# Patient Record
Sex: Female | Born: 1965 | Race: White | Hispanic: No | Smoking: Never smoker
Health system: Southern US, Community
[De-identification: ages and names within clinical notes are randomized; demographics above are authoritative.]

## PROBLEM LIST (undated history)

## (undated) DIAGNOSIS — B009 Herpesviral infection, unspecified: Secondary | ICD-10-CM

## (undated) DIAGNOSIS — K589 Irritable bowel syndrome without diarrhea: Secondary | ICD-10-CM

## (undated) DIAGNOSIS — E785 Hyperlipidemia, unspecified: Secondary | ICD-10-CM

## (undated) DIAGNOSIS — H9400 Acoustic neuritis in infectious and parasitic diseases classified elsewhere, unspecified ear: Secondary | ICD-10-CM

## (undated) DIAGNOSIS — R7301 Impaired fasting glucose: Secondary | ICD-10-CM

## (undated) DIAGNOSIS — L719 Rosacea, unspecified: Secondary | ICD-10-CM

## (undated) DIAGNOSIS — B977 Papillomavirus as the cause of diseases classified elsewhere: Secondary | ICD-10-CM

## (undated) DIAGNOSIS — K4090 Unilateral inguinal hernia, without obstruction or gangrene, not specified as recurrent: Principal | ICD-10-CM

## (undated) DIAGNOSIS — I8390 Asymptomatic varicose veins of unspecified lower extremity: Secondary | ICD-10-CM

## (undated) DIAGNOSIS — K219 Gastro-esophageal reflux disease without esophagitis: Secondary | ICD-10-CM

## (undated) DIAGNOSIS — R12 Heartburn: Secondary | ICD-10-CM

## (undated) HISTORY — PX: COLONSCOPY BEDSIDE: WVUENDOPRO140

## (undated) HISTORY — PX: HX HIP REPLACEMENT: SHX124

## (undated) HISTORY — PX: HX UPPER ENDOSCOPY: 2100001144

## (undated) HISTORY — DX: Herpesviral infection, unspecified: B00.9

## (undated) HISTORY — PX: LAPAROSCOPIC UNILATERAL SALPINGO OOPHERECTOMY: SHX5935

## (undated) HISTORY — DX: Papillomavirus as the cause of diseases classified elsewhere: B97.7

## (undated) HISTORY — DX: Irritable bowel syndrome, unspecified: K58.9

## (undated) HISTORY — DX: Asymptomatic varicose veins of unspecified lower extremity: I83.90

## (undated) HISTORY — DX: Hyperlipidemia, unspecified: E78.5

## (undated) HISTORY — DX: Impaired fasting glucose: R73.01

## (undated) HISTORY — DX: Unilateral inguinal hernia, without obstruction or gangrene, not specified as recurrent: K40.90

## (undated) HISTORY — PX: KNEE SURGERY: SHX244

## (undated) HISTORY — DX: Morbid (severe) obesity due to excess calories: E66.01

## (undated) HISTORY — PX: FOOT SURGERY: SHX648

## (undated) HISTORY — PX: NASAL SINUS SURGERY: SHX719

## (undated) HISTORY — DX: Acoustic neuritis in infectious and parasitic diseases classified elsewhere, unspecified ear: H94.00

---
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
## 1991-03-10 HISTORY — PX: ABDOMINAL HYSTERECTOMY: SHX81

## 1999-04-02 ENCOUNTER — Encounter: Admission: RE | Admit: 1999-04-02 | Discharge: 1999-04-02 | Payer: Self-pay | Admitting: Gastroenterology

## 1999-04-02 ENCOUNTER — Encounter: Payer: Self-pay | Admitting: Gastroenterology

## 1999-04-09 ENCOUNTER — Ambulatory Visit (HOSPITAL_COMMUNITY): Admission: RE | Admit: 1999-04-09 | Discharge: 1999-04-09 | Payer: Self-pay | Admitting: Gastroenterology

## 1999-04-24 ENCOUNTER — Ambulatory Visit (HOSPITAL_COMMUNITY): Admission: RE | Admit: 1999-04-24 | Discharge: 1999-04-24 | Payer: Self-pay | Admitting: Gastroenterology

## 1999-04-24 ENCOUNTER — Encounter: Payer: Self-pay | Admitting: Gastroenterology

## 1999-07-16 ENCOUNTER — Encounter: Admission: RE | Admit: 1999-07-16 | Discharge: 1999-07-16 | Payer: Self-pay | Admitting: Gastroenterology

## 1999-07-16 ENCOUNTER — Encounter: Payer: Self-pay | Admitting: Gastroenterology

## 2000-03-18 ENCOUNTER — Other Ambulatory Visit: Admission: RE | Admit: 2000-03-18 | Discharge: 2000-03-18 | Payer: Self-pay | Admitting: Obstetrics and Gynecology

## 2000-10-21 ENCOUNTER — Ambulatory Visit: Admission: RE | Admit: 2000-10-21 | Discharge: 2000-10-21 | Payer: Self-pay | Admitting: Obstetrics and Gynecology

## 2004-06-04 ENCOUNTER — Other Ambulatory Visit: Admission: RE | Admit: 2004-06-04 | Discharge: 2004-06-04 | Payer: Self-pay | Admitting: Obstetrics and Gynecology

## 2005-06-15 ENCOUNTER — Other Ambulatory Visit: Admission: RE | Admit: 2005-06-15 | Discharge: 2005-06-15 | Payer: Self-pay | Admitting: Obstetrics and Gynecology

## 2010-05-13 ENCOUNTER — Other Ambulatory Visit: Payer: Self-pay | Admitting: Obstetrics and Gynecology

## 2010-05-27 ENCOUNTER — Other Ambulatory Visit: Payer: Self-pay | Admitting: Obstetrics and Gynecology

## 2011-03-13 ENCOUNTER — Other Ambulatory Visit (HOSPITAL_BASED_OUTPATIENT_CLINIC_OR_DEPARTMENT_OTHER): Payer: Self-pay | Admitting: Family Medicine

## 2011-03-13 ENCOUNTER — Ambulatory Visit (HOSPITAL_BASED_OUTPATIENT_CLINIC_OR_DEPARTMENT_OTHER)
Admission: RE | Admit: 2011-03-13 | Discharge: 2011-03-13 | Disposition: A | Discharge status: Home / Self Care | Payer: 59 | Source: Ambulatory Visit | Attending: Family Medicine | Admitting: Family Medicine

## 2011-03-13 DIAGNOSIS — R05 Cough: Secondary | ICD-10-CM

## 2011-03-13 DIAGNOSIS — R059 Cough, unspecified: Secondary | ICD-10-CM | POA: Insufficient documentation

## 2011-04-10 LAB — HM PAP SMEAR

## 2011-04-15 ENCOUNTER — Other Ambulatory Visit (HOSPITAL_BASED_OUTPATIENT_CLINIC_OR_DEPARTMENT_OTHER): Payer: Self-pay | Admitting: Obstetrics and Gynecology

## 2011-04-15 DIAGNOSIS — Z1231 Encounter for screening mammogram for malignant neoplasm of breast: Secondary | ICD-10-CM

## 2011-04-30 ENCOUNTER — Other Ambulatory Visit: Payer: Self-pay | Admitting: Dermatology

## 2011-05-20 ENCOUNTER — Ambulatory Visit (HOSPITAL_BASED_OUTPATIENT_CLINIC_OR_DEPARTMENT_OTHER): Payer: 59

## 2011-05-27 ENCOUNTER — Ambulatory Visit (HOSPITAL_BASED_OUTPATIENT_CLINIC_OR_DEPARTMENT_OTHER)
Admission: RE | Admit: 2011-05-27 | Discharge: 2011-05-27 | Disposition: A | Discharge status: Home / Self Care | Payer: 59 | Source: Ambulatory Visit | Attending: Obstetrics and Gynecology | Admitting: Obstetrics and Gynecology

## 2011-05-27 DIAGNOSIS — Z1231 Encounter for screening mammogram for malignant neoplasm of breast: Secondary | ICD-10-CM | POA: Insufficient documentation

## 2011-05-28 ENCOUNTER — Other Ambulatory Visit (HOSPITAL_BASED_OUTPATIENT_CLINIC_OR_DEPARTMENT_OTHER): Payer: Self-pay | Admitting: Family Medicine

## 2011-05-28 ENCOUNTER — Ambulatory Visit (HOSPITAL_BASED_OUTPATIENT_CLINIC_OR_DEPARTMENT_OTHER)
Admission: RE | Admit: 2011-05-28 | Discharge: 2011-05-28 | Disposition: A | Discharge status: Home / Self Care | Payer: 59 | Source: Ambulatory Visit | Attending: Family Medicine | Admitting: Family Medicine

## 2011-05-28 DIAGNOSIS — J328 Other chronic sinusitis: Secondary | ICD-10-CM

## 2011-05-28 DIAGNOSIS — J329 Chronic sinusitis, unspecified: Secondary | ICD-10-CM

## 2011-05-28 DIAGNOSIS — R070 Pain in throat: Secondary | ICD-10-CM | POA: Insufficient documentation

## 2011-06-18 ENCOUNTER — Other Ambulatory Visit: Payer: Self-pay | Admitting: Obstetrics and Gynecology

## 2011-06-18 DIAGNOSIS — R1032 Left lower quadrant pain: Secondary | ICD-10-CM

## 2011-06-18 DIAGNOSIS — M549 Dorsalgia, unspecified: Secondary | ICD-10-CM

## 2011-06-19 ENCOUNTER — Ambulatory Visit
Admission: RE | Admit: 2011-06-19 | Discharge: 2011-06-19 | Disposition: A | Discharge status: Home / Self Care | Payer: 59 | Source: Ambulatory Visit | Attending: Obstetrics and Gynecology | Admitting: Obstetrics and Gynecology

## 2011-06-19 DIAGNOSIS — M549 Dorsalgia, unspecified: Secondary | ICD-10-CM

## 2011-06-19 DIAGNOSIS — R1032 Left lower quadrant pain: Secondary | ICD-10-CM

## 2011-06-19 MED ORDER — IOHEXOL 300 MG/ML  SOLN
125.0000 mL | Freq: Once | INTRAMUSCULAR | Status: AC | PRN
  Administered 2011-06-19: 125 mL via INTRAVENOUS

## 2011-06-25 ENCOUNTER — Ambulatory Visit (INDEPENDENT_AMBULATORY_CARE_PROVIDER_SITE_OTHER): Payer: 59 | Admitting: Surgery

## 2011-06-25 ENCOUNTER — Encounter (INDEPENDENT_AMBULATORY_CARE_PROVIDER_SITE_OTHER): Payer: Self-pay | Admitting: Surgery

## 2011-06-25 VITALS — BP 132/86 | HR 70 | Temp 97.3°F | Resp 16 | Ht 69.0 in | Wt 273.2 lb

## 2011-06-25 DIAGNOSIS — K4090 Unilateral inguinal hernia, without obstruction or gangrene, not specified as recurrent: Secondary | ICD-10-CM | POA: Insufficient documentation

## 2011-06-25 HISTORY — DX: Unilateral inguinal hernia, without obstruction or gangrene, not specified as recurrent: K40.90

## 2011-06-25 NOTE — Patient Instructions (Signed)
We will schedule your surgery today. 

## 2011-06-25 NOTE — Progress Notes (Signed)
Patient ID: Betty Cooper, female   DOB: 12-29-1965, 46 y.o.   MRN: [id number]  Chief Complaint  Patient presents with  . Inguinal Hernia      HPI Betty Cooper is a 46 y.o. female.  Referred by Dr. Cherly Hensen for evaluation of left inguinal hernia HPI 46 yo female presents with a 5 week history of constant left groin pain.  She denies any swelling in this area.  She does have a left ovary remaining, but no abnormalities were noted on pelvic exam and ultrasound.  A CT scan shows a left inguinal hernia containing only fat.  She denies any obstructive symptoms.   Past Medical History  Diagnosis Date  . Left inguinal hernia 06/25/2011  History of severe endometriosis  Past Surgical History  Procedure Date  . Foot surgery   . Abdominal hysterectomy 1993    Partial hysterectomy  . Tonsillectomy and adenoidectomy 1987    Family History  Problem Relation Age of Onset  . Cancer Paternal Grandfather     colon cancer    Social History History  Substance Use Topics  . Smoking status: Not on file  . Smokeless tobacco: Not on file  . Alcohol Use: 0.0 oz/week    1-2 drink(s) per week    No Known Allergies  Current Outpatient Prescriptions  Medication Sig Dispense Refill  . benzonatate (TESSALON) 200 MG capsule       . chlorpheniramine-HYDROcodone (TUSSIONEX) 10-8 MG/5ML LQCR       . HYDROcodone-acetaminophen (VICODIN) 5-500 MG per tablet       . HYDROcodone-homatropine (HYCODAN) 5-1.5 MG/5ML syrup       . ibuprofen (ADVIL,MOTRIN) 800 MG tablet         Review of Systems Review of Systems  Constitutional: Negative for fever, chills and unexpected weight change.  HENT: Negative for hearing loss, congestion, sore throat, trouble swallowing and voice change.   Eyes: Negative for visual disturbance.  Respiratory: Negative for cough and wheezing.   Cardiovascular: Negative for chest pain, palpitations and leg swelling.  Gastrointestinal: Positive for abdominal pain. Negative for  nausea, vomiting, diarrhea, constipation, blood in stool, abdominal distention and anal bleeding.  Genitourinary: Negative for hematuria, vaginal bleeding and difficulty urinating.  Musculoskeletal: Negative for arthralgias.  Skin: Negative for rash and wound.  Neurological: Negative for seizures, syncope and headaches.  Hematological: Negative for adenopathy. Does not bruise/bleed easily.  Psychiatric/Behavioral: Negative for confusion.    Blood pressure 132/86, pulse 70, temperature 97.3 F (36.3 C), temperature source Temporal, resp. rate 16, height 5\' 9"  (1.753 m), weight 273 lb 3.2 oz (123.923 kg).  Physical Exam Physical Exam WDWN in NAD HEENT:  EOMI, sclera anicteric Neck:  No masses, no thyromegaly Lungs:  CTA bilaterally; normal respiratory effort CV:  Regular rate and rhythm; no murmurs Abd:  +bowel sounds, soft, non-tender, no masses GU:  No visible bulges; palpable reducible left inguinal hernia Ext:  Well-perfused; no edema Skin:  Warm, dry; no sign of jaundice  Data Reviewed CT abdomen/pelvis  Assessment    Left inguinal hernia    Plan    Left inguinal hernia repair with mesh.  The surgical procedure has been discussed with the patient.  Potential risks, benefits, alternative treatments, and expected outcomes have been explained.  All of the patient's questions at this time have been answered.  The likelihood of reaching the patient's treatment goal is good.  The patient understand the proposed surgical procedure and wishes to proceed.  Danton Palmateer K. 06/25/2011, 11:58 AM

## 2011-07-02 ENCOUNTER — Telehealth (INDEPENDENT_AMBULATORY_CARE_PROVIDER_SITE_OTHER): Payer: Self-pay | Admitting: Surgery

## 2011-07-07 ENCOUNTER — Encounter (HOSPITAL_COMMUNITY): Payer: Self-pay | Admitting: Pharmacy Technician

## 2011-07-08 ENCOUNTER — Telehealth (INDEPENDENT_AMBULATORY_CARE_PROVIDER_SITE_OTHER): Payer: Self-pay | Admitting: Surgery

## 2011-07-09 ENCOUNTER — Encounter (HOSPITAL_COMMUNITY)
Admission: RE | Admit: 2011-07-09 | Discharge: 2011-07-09 | Disposition: A | Discharge status: Home / Self Care | Payer: 59 | Source: Ambulatory Visit | Attending: Surgery | Admitting: Surgery

## 2011-07-09 ENCOUNTER — Encounter (HOSPITAL_COMMUNITY): Payer: Self-pay

## 2011-07-09 HISTORY — DX: Rosacea, unspecified: L71.9

## 2011-07-09 LAB — SURGICAL PCR SCREEN: Staphylococcus aureus: NEGATIVE

## 2011-07-09 NOTE — Patient Instructions (Signed)
[address] [name]  07/09/2011   Your procedure is scheduled on: 07/10/11  Report to SHORT STAY DEPT  at 5:30 AM.  Call this number if you have problems the morning of surgery: [phone]   Remember:   Do not eat food AFTER MIDNIGHT  May have clear liquids UNTIL 6 HOURS BEFORE SURGERY  Clear liquids include soda, tea, black coffee, apple or grape juice, broth.  Take these medicines the morning of surgery with A SIP OF WATER:    Do not wear jewelry, make-up or nail polish.  Do not wear lotions, powders, or perfumes. You may wear deodorant.  Do not shave legs or underarms 48 hrs. before surgery (men may shave face)  Do not bring valuables to the hospital.  Contacts, dentures or bridgework may not be worn into surgery.  Leave suitcase in the car. After surgery it may be brought to your room.  For patients admitted to the hospital, checkout time is 11:00 AM the day of discharge.   Patients discharged the day of surgery will not be allowed to drive home.       Please read over the following fact sheets that you were given: MRSA Information

## 2011-07-10 ENCOUNTER — Encounter (HOSPITAL_COMMUNITY): Payer: Self-pay | Admitting: Anesthesiology

## 2011-07-10 ENCOUNTER — Ambulatory Visit (HOSPITAL_COMMUNITY): Payer: 59 | Admitting: Anesthesiology

## 2011-07-10 ENCOUNTER — Encounter (HOSPITAL_COMMUNITY): Payer: Self-pay | Admitting: *Deleted

## 2011-07-10 ENCOUNTER — Encounter (HOSPITAL_COMMUNITY)
Admission: RE | Disposition: A | Discharge status: Home / Self Care | Payer: Self-pay | Source: Ambulatory Visit | Attending: Surgery

## 2011-07-10 ENCOUNTER — Ambulatory Visit (HOSPITAL_COMMUNITY)
Admission: RE | Admit: 2011-07-10 | Discharge: 2011-07-10 | Disposition: A | Discharge status: Home / Self Care | Payer: 59 | Source: Ambulatory Visit | Attending: Surgery | Admitting: Surgery

## 2011-07-10 DIAGNOSIS — Z01812 Encounter for preprocedural laboratory examination: Secondary | ICD-10-CM | POA: Insufficient documentation

## 2011-07-10 DIAGNOSIS — Z90710 Acquired absence of both cervix and uterus: Secondary | ICD-10-CM | POA: Insufficient documentation

## 2011-07-10 DIAGNOSIS — K4090 Unilateral inguinal hernia, without obstruction or gangrene, not specified as recurrent: Secondary | ICD-10-CM

## 2011-07-10 DIAGNOSIS — Z79899 Other long term (current) drug therapy: Secondary | ICD-10-CM | POA: Insufficient documentation

## 2011-07-10 HISTORY — PX: INGUINAL HERNIA REPAIR: SHX194

## 2011-07-10 SURGERY — REPAIR, HERNIA, INGUINAL, ADULT
Anesthesia: General | Wound class: Clean

## 2011-07-10 MED ORDER — BUPIVACAINE-EPINEPHRINE 0.25% -1:200000 IJ SOLN
INTRAMUSCULAR | Status: AC
  Filled 2011-07-10: qty 1

## 2011-07-10 MED ORDER — LACTATED RINGERS IV SOLN
INTRAVENOUS | Status: DC | PRN
  Administered 2011-07-10: 07:00:00 via INTRAVENOUS

## 2011-07-10 MED ORDER — MORPHINE SULFATE 10 MG/ML IJ SOLN: 2.0000 mg | INTRAMUSCULAR | Status: DC | PRN

## 2011-07-10 MED ORDER — MIDAZOLAM HCL 5 MG/5ML IJ SOLN
INTRAMUSCULAR | Status: DC | PRN
  Administered 2011-07-10: 2 mg via INTRAVENOUS

## 2011-07-10 MED ORDER — ACETAMINOPHEN 10 MG/ML IV SOLN
INTRAVENOUS | Status: DC | PRN
  Administered 2011-07-10: 1000 mg via INTRAVENOUS

## 2011-07-10 MED ORDER — PROMETHAZINE HCL 25 MG/ML IJ SOLN: 6.2500 mg | INTRAMUSCULAR | Status: DC | PRN

## 2011-07-10 MED ORDER — ACETAMINOPHEN 10 MG/ML IV SOLN
INTRAVENOUS | Status: AC
Start: 2011-07-10 — End: 2011-07-10
  Filled 2011-07-10: qty 100

## 2011-07-10 MED ORDER — FENTANYL CITRATE 0.05 MG/ML IJ SOLN
INTRAMUSCULAR | Status: DC | PRN
  Administered 2011-07-10 (×2): 100 ug via INTRAVENOUS

## 2011-07-10 MED ORDER — NEOSTIGMINE METHYLSULFATE 1 MG/ML IJ SOLN
INTRAMUSCULAR | Status: DC | PRN
  Administered 2011-07-10: 2.5 mg via INTRAVENOUS

## 2011-07-10 MED ORDER — OXYCODONE-ACETAMINOPHEN 5-325 MG PO TABS
1.0000 | ORAL_TABLET | ORAL | Status: DC | PRN
  Administered 2011-07-10: 1 via ORAL

## 2011-07-10 MED ORDER — ONDANSETRON HCL 4 MG/2ML IJ SOLN
INTRAMUSCULAR | Status: DC | PRN
  Administered 2011-07-10: 4 mg via INTRAVENOUS

## 2011-07-10 MED ORDER — OXYCODONE-ACETAMINOPHEN 5-325 MG PO TABS
ORAL_TABLET | ORAL | Status: AC
  Filled 2011-07-10: qty 1

## 2011-07-10 MED ORDER — LIDOCAINE HCL (CARDIAC) 20 MG/ML IV SOLN
INTRAVENOUS | Status: DC | PRN
  Administered 2011-07-10: 100 mg via INTRAVENOUS

## 2011-07-10 MED ORDER — KETOROLAC TROMETHAMINE 30 MG/ML IJ SOLN
INTRAMUSCULAR | Status: AC
  Filled 2011-07-10: qty 1

## 2011-07-10 MED ORDER — ROCURONIUM BROMIDE 100 MG/10ML IV SOLN
INTRAVENOUS | Status: DC | PRN
  Administered 2011-07-10: 40 mg via INTRAVENOUS

## 2011-07-10 MED ORDER — FENTANYL CITRATE 0.05 MG/ML IJ SOLN
INTRAMUSCULAR | Status: AC
  Filled 2011-07-10: qty 2

## 2011-07-10 MED ORDER — PROPOFOL 10 MG/ML IV EMUL
INTRAVENOUS | Status: DC | PRN
  Administered 2011-07-10: 200 mg via INTRAVENOUS

## 2011-07-10 MED ORDER — OXYCODONE-ACETAMINOPHEN 5-325 MG PO TABS: 1.0000 | ORAL_TABLET | ORAL | Status: DC | PRN

## 2011-07-10 MED ORDER — BUPIVACAINE-EPINEPHRINE 0.25% -1:200000 IJ SOLN
INTRAMUSCULAR | Status: DC | PRN
  Administered 2011-07-10: 20 mL

## 2011-07-10 MED ORDER — LACTATED RINGERS IV SOLN
INTRAVENOUS | Status: DC
  Administered 2011-07-10: 1000 mL via INTRAVENOUS

## 2011-07-10 MED ORDER — GLYCOPYRROLATE 0.2 MG/ML IJ SOLN
INTRAMUSCULAR | Status: DC | PRN
  Administered 2011-07-10: 0.4 mg via INTRAVENOUS

## 2011-07-10 MED ORDER — CEFAZOLIN SODIUM-DEXTROSE 2-3 GM-% IV SOLR
2.0000 g | INTRAVENOUS | Status: AC
  Administered 2011-07-10: 2 g via INTRAVENOUS

## 2011-07-10 MED ORDER — ONDANSETRON HCL 4 MG/2ML IJ SOLN: 4.0000 mg | INTRAMUSCULAR | Status: DC | PRN

## 2011-07-10 MED ORDER — FENTANYL CITRATE 0.05 MG/ML IJ SOLN
25.0000 ug | INTRAMUSCULAR | Status: DC | PRN
  Administered 2011-07-10 (×4): 25 ug via INTRAVENOUS

## 2011-07-10 MED ORDER — CEFAZOLIN SODIUM-DEXTROSE 2-3 GM-% IV SOLR
INTRAVENOUS | Status: AC
  Filled 2011-07-10: qty 50

## 2011-07-10 MED ORDER — 0.9 % SODIUM CHLORIDE (POUR BTL) OPTIME
TOPICAL | Status: DC | PRN
  Administered 2011-07-10: 1000 mL

## 2011-07-10 MED ORDER — KETOROLAC TROMETHAMINE 30 MG/ML IJ SOLN
30.0000 mg | Freq: Once | INTRAMUSCULAR | Status: AC
  Administered 2011-07-10: 30 mg via INTRAVENOUS

## 2011-07-10 SURGICAL SUPPLY — 43 items
APL SKNCLS STERI-STRIP NONHPOA (GAUZE/BANDAGES/DRESSINGS) ×2
BENZOIN TINCTURE PRP APPL 2/3 (GAUZE/BANDAGES/DRESSINGS) ×3 IMPLANT
BLADE HEX COATED 2.75 (ELECTRODE) ×3 IMPLANT
CHLORAPREP W/TINT 26ML (MISCELLANEOUS) ×4 IMPLANT
CLOTH BEACON ORANGE TIMEOUT ST (SAFETY) ×3 IMPLANT
COVER SURGICAL LIGHT HANDLE (MISCELLANEOUS) ×3 IMPLANT
DECANTER SPIKE VIAL GLASS SM (MISCELLANEOUS) ×3 IMPLANT
DISSECTOR ROUND CHERRY 3/8 STR (MISCELLANEOUS) ×3 IMPLANT
DRAIN PENROSE 18X1/2 LTX STRL (DRAIN) IMPLANT
DRAPE LAPAROTOMY TRNSV 102X78 (DRAPE) ×3 IMPLANT
DRAPE UTILITY XL STRL (DRAPES) ×3 IMPLANT
DRSG TEGADERM 4X4.75 (GAUZE/BANDAGES/DRESSINGS) ×1 IMPLANT
ELECT REM PT RETURN 9FT ADLT (ELECTROSURGICAL) ×3
ELECTRODE REM PT RTRN 9FT ADLT (ELECTROSURGICAL) ×2 IMPLANT
GAUZE SPONGE 4X4 16PLY XRAY LF (GAUZE/BANDAGES/DRESSINGS) ×1 IMPLANT
GLOVE BIO SURGEON STRL SZ7 (GLOVE) ×3 IMPLANT
GLOVE BIOGEL PI IND STRL 7.5 (GLOVE) ×2 IMPLANT
GLOVE BIOGEL PI INDICATOR 7.5 (GLOVE) ×1
GOWN STRL NON-REIN LRG LVL3 (GOWN DISPOSABLE) ×6 IMPLANT
GOWN STRL REIN XL XLG (GOWN DISPOSABLE) ×3 IMPLANT
KIT BASIN OR (CUSTOM PROCEDURE TRAY) ×3 IMPLANT
MESH ULTRAPRO 3X6 7.6X15CM (Mesh General) ×1 IMPLANT
NDL HYPO 25X1 1.5 SAFETY (NEEDLE) ×2 IMPLANT
NEEDLE HYPO 25X1 1.5 SAFETY (NEEDLE) ×3 IMPLANT
NS IRRIG 1000ML POUR BTL (IV SOLUTION) ×3 IMPLANT
PACK GENERAL/GYN (CUSTOM PROCEDURE TRAY) ×3 IMPLANT
SPONGE GAUZE 4X4 12PLY (GAUZE/BANDAGES/DRESSINGS) ×3 IMPLANT
STRIP CLOSURE SKIN 1/2X4 (GAUZE/BANDAGES/DRESSINGS) ×3 IMPLANT
SUT MNCRL AB 4-0 PS2 18 (SUTURE) ×3 IMPLANT
SUT PROLENE 2 0 SH DA (SUTURE) ×3 IMPLANT
SUT SILK 2 0 SH (SUTURE) IMPLANT
SUT SILK 3 0 (SUTURE) ×3
SUT SILK 3-0 18XBRD TIE 12 (SUTURE) IMPLANT
SUT VIC AB 2-0 CT2 27 (SUTURE) IMPLANT
SUT VIC AB 2-0 SH 27 (SUTURE) ×3
SUT VIC AB 2-0 SH 27X BRD (SUTURE) ×2 IMPLANT
SUT VIC AB 3-0 SH 27 (SUTURE) ×3
SUT VIC AB 3-0 SH 27X BRD (SUTURE) IMPLANT
SUT VIC AB 3-0 SH 27XBRD (SUTURE) IMPLANT
SUT VICRYL 0 27 CT2 27 ABS (SUTURE) ×1 IMPLANT
SUT VICRYL 0 UR6 27IN ABS (SUTURE) IMPLANT
SYR CONTROL 10ML LL (SYRINGE) ×3 IMPLANT
TOWEL OR 17X26 10 PK STRL BLUE (TOWEL DISPOSABLE) ×6 IMPLANT

## 2011-07-10 NOTE — Op Note (Signed)
Hernia, Open, Procedure Note  Indications: The patient presented with a history of a left, reducible inguinal hernia.    Pre-operative Diagnosis: left reducible inguinal hernia Post-operative Diagnosis: same  Surgeon: Wynona Luna.   Assistants: none   Anesthesia: General endotracheal anesthesia  ASA Class: 2  Procedure Details  The patient was seen again in the Holding Room. The risks, benefits, complications, treatment options, and expected outcomes were discussed with the patient. The possibilities of reaction to medication, pulmonary aspiration, perforation of viscus, bleeding, recurrent infection, the need for additional procedures, and development of a complication requiring transfusion or further operation were discussed with the patient and/or family. The likelihood of success in repairing the hernia and returning the patient to their previous functional status is good.  There was concurrence with the proposed plan, and informed consent was obtained. The site of surgery was properly noted/marked. The patient was taken to the Operating Room, identified as Betty Cooper, and the procedure verified as left inguinal hernia repair. A Time Out was held and the above information confirmed.  The patient was placed in the supine position and underwent induction of anesthesia. The lower abdomen and groin was prepped with Chloraprep and draped in the standard fashion, and 0.5% Marcaine with epinephrine was used to anesthetize the skin over the mid-portion of the inguinal canal. An oblique incision was made. Dissection was carried down through the subcutaneous tissue with cautery to the external oblique fascia.  The hernia sac was bulging through the external ring.  We opened the external oblique fascia along the direction of its fibers to the external ring.    The floor of the inguinal canal was inspected and showed a large direct defect.  The hernia sac was dissected free and reduced back  through the direct defect. We used a 3 x 6 inch piece of Ultrapro mesh, which was cut into a oval shape.  This was secured with 2-0 Prolene, beginning at the pubic tubercle, running this along the internal oblique fascia superiorly and the shelving edge inferiorly.  The mesh was tucked underneath the external oblique fascia laterally.  The external oblique fascia was reapproximated with 2-0 Vicryl.  3-0 Vicryl was used to close the subcutaneous tissues and 4-0 Monocryl was used to close the skin in subcuticular fashion.  Benzoin and steri-strips were used to seal the incision.  A clean dressing was applied.  The patient was then extubated and brought to the recovery room in stable condition.  All sponge, instrument, and needle counts were correct prior to closure and at the conclusion of the case.   Estimated Blood Loss: Minimal                 Complications: None; patient tolerated the procedure well.         Disposition: PACU - hemodynamically stable.         Condition: stable  Wilmon Arms. Corliss Skains, MD, [facility]  07/10/2011 9:07 AM

## 2011-07-10 NOTE — Anesthesia Postprocedure Evaluation (Signed)
  Anesthesia Post-op Note  Anesthesia Post Note  Patient: Betty Cooper  Procedure(s) Performed: Procedure(s) (LRB): HERNIA REPAIR INGUINAL ADULT (Left) INSERTION OF MESH (N/A)  Anesthesia type: General  Patient location: PACU  Post pain: Pain level controlled  Post assessment: Post-op Vital signs reviewed  Last Vitals:  Filed Vitals:   07/10/11 0906  BP:   Pulse:   Temp: 36.4 C  Resp:     Post vital signs: Reviewed  Level of consciousness: sedated  Complications: No apparent anesthesia complications

## 2011-07-10 NOTE — H&P (View-Only) (Signed)
Patient ID: Betty Cooper, female   DOB: 05/20/1965, 46 y.o.   MRN: [id number]  Chief Complaint  Patient presents with  . Inguinal Hernia      HPI Betty Cooper is a 46 y.o. female.  Referred by Dr. Cousins for evaluation of left inguinal hernia HPI 46 yo female presents with a 5 week history of constant left groin pain.  She denies any swelling in this area.  She does have a left ovary remaining, but no abnormalities were noted on pelvic exam and ultrasound.  A CT scan shows a left inguinal hernia containing only fat.  She denies any obstructive symptoms.   Past Medical History  Diagnosis Date  . Left inguinal hernia 06/25/2011  History of severe endometriosis  Past Surgical History  Procedure Date  . Foot surgery   . Abdominal hysterectomy 1993    Partial hysterectomy  . Tonsillectomy and adenoidectomy 1987    Family History  Problem Relation Age of Onset  . Cancer Paternal Grandfather     colon cancer    Social History History  Substance Use Topics  . Smoking status: Not on file  . Smokeless tobacco: Not on file  . Alcohol Use: 0.0 oz/week    1-2 drink(s) per week    No Known Allergies  Current Outpatient Prescriptions  Medication Sig Dispense Refill  . benzonatate (TESSALON) 200 MG capsule       . chlorpheniramine-HYDROcodone (TUSSIONEX) 10-8 MG/5ML LQCR       . HYDROcodone-acetaminophen (VICODIN) 5-500 MG per tablet       . HYDROcodone-homatropine (HYCODAN) 5-1.5 MG/5ML syrup       . ibuprofen (ADVIL,MOTRIN) 800 MG tablet         Review of Systems Review of Systems  Constitutional: Negative for fever, chills and unexpected weight change.  HENT: Negative for hearing loss, congestion, sore throat, trouble swallowing and voice change.   Eyes: Negative for visual disturbance.  Respiratory: Negative for cough and wheezing.   Cardiovascular: Negative for chest pain, palpitations and leg swelling.  Gastrointestinal: Positive for abdominal pain. Negative for  nausea, vomiting, diarrhea, constipation, blood in stool, abdominal distention and anal bleeding.  Genitourinary: Negative for hematuria, vaginal bleeding and difficulty urinating.  Musculoskeletal: Negative for arthralgias.  Skin: Negative for rash and wound.  Neurological: Negative for seizures, syncope and headaches.  Hematological: Negative for adenopathy. Does not bruise/bleed easily.  Psychiatric/Behavioral: Negative for confusion.    Blood pressure 132/86, pulse 70, temperature 97.3 F (36.3 C), temperature source Temporal, resp. rate 16, height 5' 9" (1.753 m), weight 273 lb 3.2 oz (123.923 kg).  Physical Exam Physical Exam WDWN in NAD HEENT:  EOMI, sclera anicteric Neck:  No masses, no thyromegaly Lungs:  CTA bilaterally; normal respiratory effort CV:  Regular rate and rhythm; no murmurs Abd:  +bowel sounds, soft, non-tender, no masses GU:  No visible bulges; palpable reducible left inguinal hernia Ext:  Well-perfused; no edema Skin:  Warm, dry; no sign of jaundice  Data Reviewed CT abdomen/pelvis  Assessment    Left inguinal hernia    Plan    Left inguinal hernia repair with mesh.  The surgical procedure has been discussed with the patient.  Potential risks, benefits, alternative treatments, and expected outcomes have been explained.  All of the patient's questions at this time have been answered.  The likelihood of reaching the patient's treatment goal is good.  The patient understand the proposed surgical procedure and wishes to proceed.          Miroslav Gin K. 06/25/2011, 11:58 AM    

## 2011-07-10 NOTE — Interval H&P Note (Signed)
History and Physical Interval Note:  07/10/2011 7:19 AM  Betty Cooper  has presented today for surgery, with the diagnosis of Left inguinal hernia  The various methods of treatment have been discussed with the patient and family. After consideration of risks, benefits and other options for treatment, the patient has consented to  Procedure(s) (LRB): HERNIA REPAIR INGUINAL ADULT (Left) INSERTION OF MESH (N/A) as a surgical intervention .  The patients' history has been reviewed, patient examined, no change in status, stable for surgery.  I have reviewed the patients' chart and labs.  Questions were answered to the patient's satisfaction.     Otto Caraway K.

## 2011-07-10 NOTE — Preoperative (Signed)
Beta Blockers   Reason not to administer Beta Blockers:Not Applicable 

## 2011-07-10 NOTE — Anesthesia Preprocedure Evaluation (Addendum)
Anesthesia Evaluation  Patient identified by MRN, date of birth, ID band Patient awake    Reviewed: Allergy & Precautions, H&P , NPO status , Patient's Chart, lab work & pertinent test results  History of Anesthesia Complications Negative for: history of anesthetic complications  Airway Mallampati: II TM Distance: >3 FB Neck ROM: Full    Dental  (+) Teeth Intact and Caps,    Pulmonary neg pulmonary ROS,  breath sounds clear to auscultation  Pulmonary exam normal       Cardiovascular negative cardio ROS  Rhythm:Regular Rate:Normal     Neuro/Psych negative neurological ROS  negative psych ROS   GI/Hepatic negative GI ROS, Neg liver ROS,   Endo/Other  negative endocrine ROSMorbid obesity  Renal/GU negative Renal ROS  negative genitourinary   Musculoskeletal negative musculoskeletal ROS (+)   Abdominal (+) + obese,   Peds negative pediatric ROS (+)  Hematology negative hematology ROS (+)   Anesthesia Other Findings   Reproductive/Obstetrics negative OB ROS                           Anesthesia Physical Anesthesia Plan  ASA: II  Anesthesia Plan: General   Post-op Pain Management:    Induction: Intravenous  Airway Management Planned: Oral ETT  Additional Equipment:   Intra-op Plan:   Post-operative Plan: Extubation in OR  Informed Consent: I have reviewed the patients History and Physical, chart, labs and discussed the procedure including the risks, benefits and alternatives for the proposed anesthesia with the patient or authorized representative who has indicated his/her understanding and acceptance.   Dental advisory given  Plan Discussed with: CRNA  Anesthesia Plan Comments:         Anesthesia Quick Evaluation

## 2011-07-10 NOTE — Transfer of Care (Signed)
Immediate Anesthesia Transfer of Care Note  Patient: Betty Cooper  Procedure(s) Performed: Procedure(s) (LRB): HERNIA REPAIR INGUINAL ADULT (Left) INSERTION OF MESH (N/A)  Patient Location: PACU  Anesthesia Type: General  Level of Consciousness: awake, alert , oriented and patient cooperative  Airway & Oxygen Therapy: Patient Spontanous Breathing and Patient connected to face mask oxygen  Post-op Assessment: Report given to PACU RN, Post -op Vital signs reviewed and stable and Patient moving all extremities X 4  Post vital signs: Reviewed and stable  Complications: No apparent anesthesia complications

## 2011-07-10 NOTE — Discharge Instructions (Signed)
[facility] ° ° INGUINAL HERNIA REPAIR: POST OP INSTRUCTIONS ° °Always review your discharge instruction sheet given to you by the facility where your surgery was performed. °IF YOU HAVE DISABILITY OR FAMILY LEAVE FORMS, YOU MUST BRING THEM TO THE OFFICE FOR PROCESSING.   °DO NOT GIVE THEM TO YOUR DOCTOR. ° °1. A  prescription for pain medication may be given to you upon discharge.  Take your pain medication as prescribed, if needed.  If narcotic pain medicine is not needed, then you may take acetaminophen (Tylenol) or ibuprofen (Advil) as needed. °2. Take your usually prescribed medications unless otherwise directed. °3. If you need a refill on your pain medication, please contact your pharmacy.  They will contact our office to request authorization. Prescriptions will not be filled after 5 pm or on week-ends. °4. You should follow a light diet the first 24 hours after arrival home, such as soup and crackers, etc.  Be sure to include lots of fluids daily.  Resume your normal diet the day after surgery. °5. Most patients will experience some swelling and bruising in the groin.  Ice packs and reclining will help.  Swelling and bruising can take several days to resolve.  °6. It is common to experience some constipation if taking pain medication after surgery.  Increasing fluid intake and taking a stool softener (such as Colace) will usually help or prevent this problem from occurring.  A mild laxative (Milk of Magnesia or Miralax) should be taken according to package directions if there are no bowel movements after 48 hours. °7. Unless discharge instructions indicate otherwise, you may remove your bandages 24-48 hours after surgery, and you may shower at that time.  You will have steri-strips (small skin tapes) in place directly over the incision.  These strips should be left on the skin for 7-10 days. °8. ACTIVITIES:  You may resume regular (light) daily activities beginning the next day--such as  daily self-care, walking, climbing stairs--gradually increasing activities as tolerated.  You may have sexual intercourse when it is comfortable.  Refrain from any heavy lifting or straining until approved by your doctor. °a. You may drive when you are no longer taking prescription pain medication, you can comfortably wear a seatbelt, and you can safely maneuver your car and apply brakes. °b. RETURN TO WORK:  2-3 weeks with light duty - no lifting over 15 lbs. °9. You should see your doctor in the office for a follow-up appointment approximately 2-3 weeks after your surgery.  Make sure that you call for this appointment within a day or two after you arrive home to insure a convenient appointment time. °10. OTHER INSTRUCTIONS:  __________________________________________________________________________________________________________________________________________________________________________________________  °WHEN TO CALL YOUR DOCTOR: °1. Fever over 101.0 °2. Inability to urinate °3. Nausea and/or vomiting °4. Extreme swelling or bruising °5. Continued bleeding from incision. °6. Increased pain, redness, or drainage from the incision ° °The clinic staff is available to answer your questions during regular business hours.  Please don’t hesitate to call and ask to speak to one of the nurses for clinical concerns.  If you have a medical emergency, go to the nearest emergency room or call 911.  A surgeon from [facility] is always on call at the hospital ° ° °[address], [address], [place], [place]  [address] ? ° [address] [address], [place], [place]   [address] °[phone]    [phone]    FAX [phone] °Web site: [email or web address] ° ° °

## 2011-07-10 NOTE — Progress Notes (Signed)
Error in recording patients preop evaluation to wrong patients medical record. Error corrected and preop evaluation recorded as indicated. Initial preop and physical assessment performed prior to surgical procedure. A Briyonna Omara MD

## 2011-07-14 ENCOUNTER — Telehealth (INDEPENDENT_AMBULATORY_CARE_PROVIDER_SITE_OTHER): Payer: Self-pay

## 2011-07-14 ENCOUNTER — Encounter (HOSPITAL_COMMUNITY): Payer: Self-pay | Admitting: Surgery

## 2011-07-14 ENCOUNTER — Ambulatory Visit (INDEPENDENT_AMBULATORY_CARE_PROVIDER_SITE_OTHER): Payer: 59 | Admitting: Surgery

## 2011-07-14 VITALS — BP 122/88 | HR 80 | Temp 98.0°F | Resp 20 | Ht 68.0 in | Wt 273.4 lb

## 2011-07-14 DIAGNOSIS — K4090 Unilateral inguinal hernia, without obstruction or gangrene, not specified as recurrent: Secondary | ICD-10-CM

## 2011-07-14 NOTE — Telephone Encounter (Signed)
Pt states she called this morning and was advised to use cortisone cream around area of redness, itching and burning under pannus and around steri strips. Pt states area  worse now, very red and still itching and burning. Pt wants to know what she should do to keep this from getting worse. Please call pt with recommendations [phone].

## 2011-07-14 NOTE — Patient Instructions (Signed)
Hydrocortisone ointment around the incision Benadryl tablets as needed

## 2011-07-14 NOTE — Progress Notes (Signed)
This patient underwent open repair of a direct left inguinal hernia with mesh on 07/10/11.  She called today to state that she was having "a lot of burning and itching" around her incision.  She is worked into the office schedule today for an urgent recheck.  She is having the expected soreness and swelling from her hernia repair.  The incision seems to be well-opposed and free of any sign of infection.  She has very well-demarcated contact dermatitis where the skin was painted with benzoin to attach the steri-strips.  The steri-strips are removed.  She is instructed to wash the area thoroughly and to apply 1% hydrocortisone ointment to the area.  She should also take Benadryl for the itching.  She has a regularly-scheduled follow-up scheduled next week and should keep that appointment.  Wilmon Arms. Corliss Skains, MD, [facility]  07/14/2011 10:10 PM

## 2011-07-21 ENCOUNTER — Encounter (INDEPENDENT_AMBULATORY_CARE_PROVIDER_SITE_OTHER): Payer: Self-pay | Admitting: Surgery

## 2011-07-21 ENCOUNTER — Ambulatory Visit (INDEPENDENT_AMBULATORY_CARE_PROVIDER_SITE_OTHER): Payer: 59 | Admitting: Surgery

## 2011-07-21 VITALS — BP 122/90 | HR 80 | Temp 98.4°F | Resp 16 | Ht 68.0 in | Wt 270.8 lb

## 2011-07-21 DIAGNOSIS — K4090 Unilateral inguinal hernia, without obstruction or gangrene, not specified as recurrent: Secondary | ICD-10-CM

## 2011-07-21 NOTE — Progress Notes (Signed)
S/p left inguinal hernia repair with mesh on 07/10/11 for a direct inguinal hernia.  Last week she was seen because of itching and redness around her incision.  She was found to have contact dermatitis from the Benzoin adhesive.  This is slightly improved with hydrocortisone ointment and Benadryl.  She continues to have some deep burning and pain.  Her incision seems to be healing well with no sign of infection.  No sign of recurrent hernia.  No hematoma noted.    She may slowly increase her level of activity over the next couple of weeks.  She may resume full activity 6 weeks post-op. F/U PRN  Wilmon Arms. Corliss Skains, MD, [facility]  07/21/2011 5:15 PM\

## 2011-07-23 ENCOUNTER — Encounter (INDEPENDENT_AMBULATORY_CARE_PROVIDER_SITE_OTHER): Payer: 59 | Admitting: Surgery

## 2011-08-05 ENCOUNTER — Encounter (INDEPENDENT_AMBULATORY_CARE_PROVIDER_SITE_OTHER): Payer: 59 | Admitting: Surgery

## 2011-09-22 ENCOUNTER — Other Ambulatory Visit: Payer: Self-pay | Admitting: Dermatology

## 2011-10-29 ENCOUNTER — Telehealth (INDEPENDENT_AMBULATORY_CARE_PROVIDER_SITE_OTHER): Payer: Self-pay | Admitting: General Surgery

## 2011-10-29 NOTE — Telephone Encounter (Signed)
Ms revell called to schedule an appointment with Dr. Corliss Skains to evaluate new symptoms since hernia surgery. She has noted intermittent drainage over a 2 week period/ oozing, no blood, from end of incision and intermittent pain around incision area. No bulge noted, no nausea or vomiting and bowels are moving without difficulty. I offered her an appointment with dr. Corliss Skains today or tomorrow morning. She is unable to come due to work schedule. I scheduled an appointment with dr. Corliss Skains on 11-13-11, and told her if symptoms worsen or change to call office to be seen sooner by DR. Tsuei or one of his partners./gy

## 2011-11-13 ENCOUNTER — Encounter (INDEPENDENT_AMBULATORY_CARE_PROVIDER_SITE_OTHER): Payer: 59 | Admitting: Surgery

## 2011-11-25 ENCOUNTER — Ambulatory Visit (INDEPENDENT_AMBULATORY_CARE_PROVIDER_SITE_OTHER): Payer: 59 | Admitting: Surgery

## 2011-11-25 ENCOUNTER — Encounter (INDEPENDENT_AMBULATORY_CARE_PROVIDER_SITE_OTHER): Payer: Self-pay | Admitting: Surgery

## 2011-11-25 VITALS — BP 126/76 | HR 66 | Temp 98.8°F | Resp 18 | Ht 69.0 in | Wt 266.0 lb

## 2011-11-25 DIAGNOSIS — K4090 Unilateral inguinal hernia, without obstruction or gangrene, not specified as recurrent: Secondary | ICD-10-CM

## 2011-11-25 NOTE — Progress Notes (Signed)
The patient underwent left inguinal hernia repair with mesh on 07/10/11. Over the last several weeks she has noticed a tiny bit of drainage on her underwear. She also continues to have a little bit of pain medially at her surgical site. She comes in today for evaluation.  Filed Vitals:   11/25/11 1525  BP: 126/76  Pulse: 66  Temp: 98.8 F (37.1 C)  Resp: 18   Her incision is intact. There is one small punctate opening in the lateral portion of the incision. There is minimal drainage coming from this area. No surrounding cellulitis. It seems that the patient's clothing is sticking to the incision and not allowing it to heal. There's no sign of recurrent hernia. She does have some tenderness at the pubic tubercle.  Impression: Superficial wound irritation Postoperative pain at the pubic tubercle Plan: She'll treat a small wound irritation with Neosporin and a Band-Aid until completely healed. She'll changes daily after showers.  I encouraged her to use ibuprofen or Aleve to help with the soreness at the pubic tubercle. We will recheck in one to 2 weeks.   Wilmon Arms. Corliss Skains, MD, [facility]  11/25/2011 4:53 PM

## 2011-12-18 ENCOUNTER — Encounter (INDEPENDENT_AMBULATORY_CARE_PROVIDER_SITE_OTHER): Payer: 59 | Admitting: Surgery

## 2012-01-19 ENCOUNTER — Encounter (INDEPENDENT_AMBULATORY_CARE_PROVIDER_SITE_OTHER): Payer: 59 | Admitting: Surgery

## 2012-02-15 ENCOUNTER — Ambulatory Visit (INDEPENDENT_AMBULATORY_CARE_PROVIDER_SITE_OTHER): Payer: 59 | Admitting: Surgery

## 2012-02-15 ENCOUNTER — Encounter (INDEPENDENT_AMBULATORY_CARE_PROVIDER_SITE_OTHER): Payer: Self-pay | Admitting: Surgery

## 2012-02-15 VITALS — BP 102/80 | HR 86 | Temp 98.5°F | Ht 68.0 in | Wt 271.6 lb

## 2012-02-15 DIAGNOSIS — IMO0002 Reserved for concepts with insufficient information to code with codable children: Secondary | ICD-10-CM

## 2012-02-15 DIAGNOSIS — T8189XA Other complications of procedures, not elsewhere classified, initial encounter: Secondary | ICD-10-CM

## 2012-02-15 NOTE — Progress Notes (Signed)
S/p LIH repair 07/10/11.  She has had persistent drainage of clear fluid from a pinpoint opening in the middle of her incision.  No sign of surrounding abscess.  No erythema or induration.  The fluid is always clear.    This appears to be folliculitis or a suture granuloma.  We prepped with betadine and anesthetized with 1% lidocaine.  I excised a 1 cm elliptical area down to normal appearing subcutaneous tissue.  The patient will treat the wound with neosporin and a bandaid. Recheck 1 week.   Wilmon Arms. Corliss Skains, MD, [facility]  02/15/2012 3:03 PM

## 2012-02-25 ENCOUNTER — Encounter (INDEPENDENT_AMBULATORY_CARE_PROVIDER_SITE_OTHER): Payer: Self-pay | Admitting: Surgery

## 2012-02-25 ENCOUNTER — Ambulatory Visit (INDEPENDENT_AMBULATORY_CARE_PROVIDER_SITE_OTHER): Payer: 59 | Admitting: Surgery

## 2012-02-25 VITALS — BP 122/88 | HR 84 | Temp 97.8°F | Resp 18 | Ht 68.0 in | Wt 277.4 lb

## 2012-02-25 DIAGNOSIS — T8189XA Other complications of procedures, not elsewhere classified, initial encounter: Secondary | ICD-10-CM

## 2012-02-25 DIAGNOSIS — IMO0002 Reserved for concepts with insufficient information to code with codable children: Secondary | ICD-10-CM

## 2012-02-25 NOTE — Progress Notes (Signed)
The patient is here for recheck of her groin incision. The wound has completely healed. No further drainage. No surrounding erythema or induration. No need for further dressing changes. Followup when necessary  Wilmon Arms. Corliss Skains, MD, [facility]  02/25/2012 11:20 AM

## 2012-03-14 ENCOUNTER — Other Ambulatory Visit (HOSPITAL_COMMUNITY): Payer: Self-pay | Admitting: Podiatry

## 2012-03-14 DIAGNOSIS — M869 Osteomyelitis, unspecified: Secondary | ICD-10-CM

## 2012-03-17 ENCOUNTER — Encounter (HOSPITAL_COMMUNITY): Payer: 59

## 2012-05-26 IMAGING — CT CT ABD-PELV W/ CM
3 of 5 series · 13 of 36 positions shown, 19 images · IV contrast (READICAT/WATER & [ID] OMNI 300)
Comparison: Report of 07/16/1999.

CLINICAL DATA: Back and left lower quadrant pain.  Symptoms of 3
weeks.  Question inguinal hernia.  Left ovarian cyst seen on
outside ultrasound.  Occasional diarrhea.  Hysterectomy and right
oophorectomy.

CT ABDOMEN AND PELVIS WITH CONTRAST
TECHNIQUE: Multidetector CT imaging of the abdomen and pelvis was
performed following the standard protocol during bolus
administration of intravenous contrast.
Contrast: 125mL OMNIPAQUE IOHEXOL 300 MG/ML  SOLN

[Series 3: abd/pelvis with · axial · 0.82mm/px · z∈[-358,+2]mm · 7 of 96 slices shown, 12 images]
[im 12/96  soft-tissue]
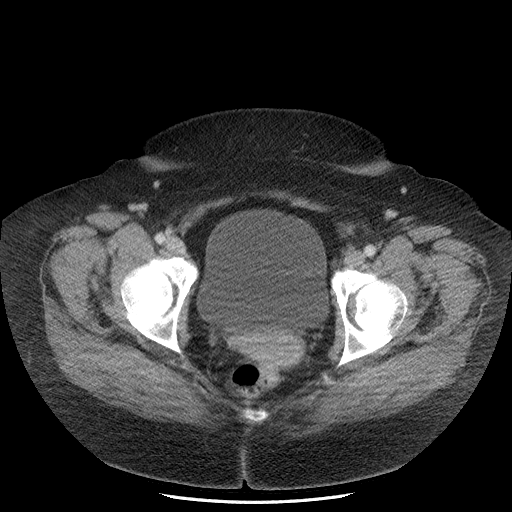
[im 12/96  bone]
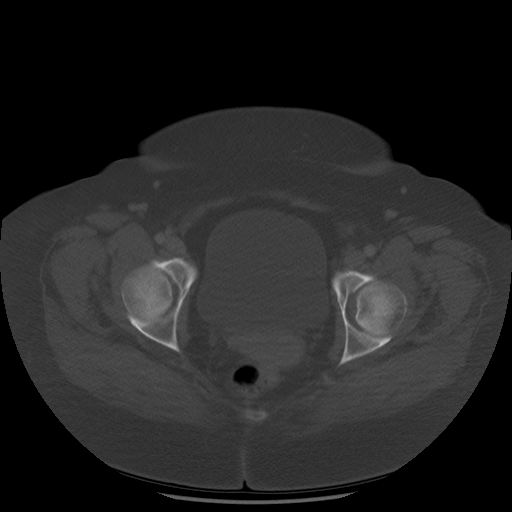
[im 24/96  soft-tissue]
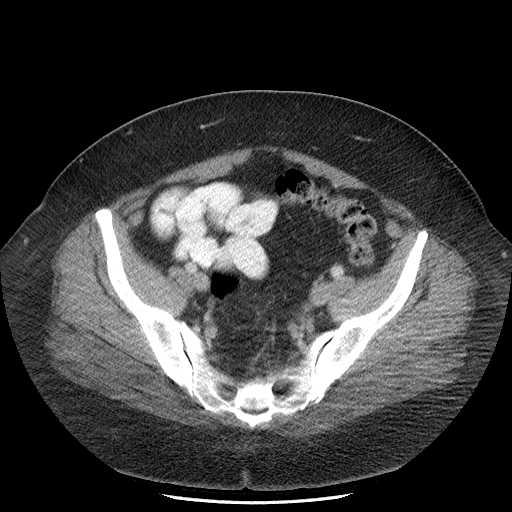
[im 36/96  soft-tissue]
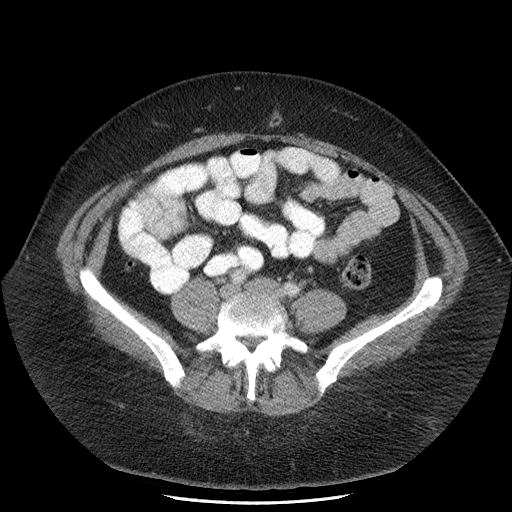
[im 48/96  soft-tissue]
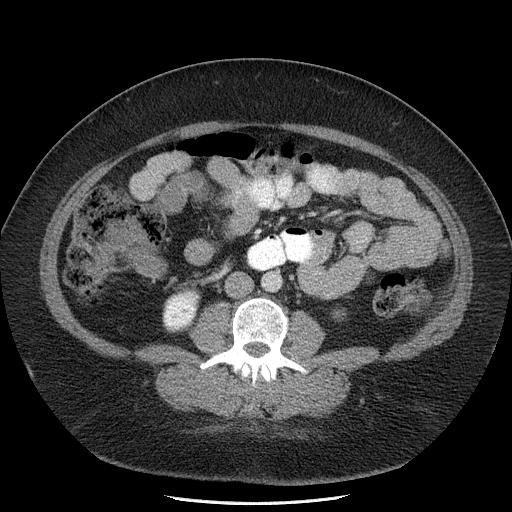
[im 48/96  lung]
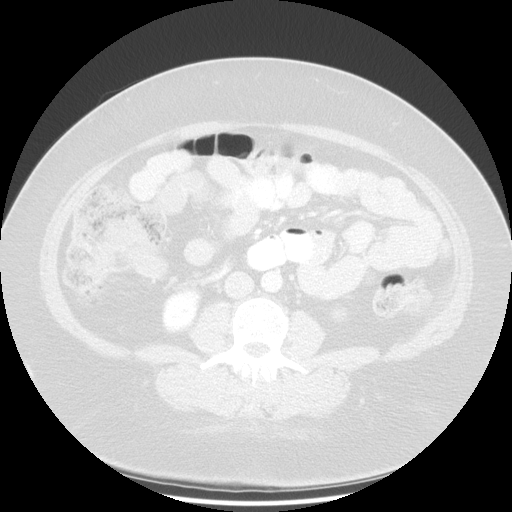
[im 60/96  soft-tissue]
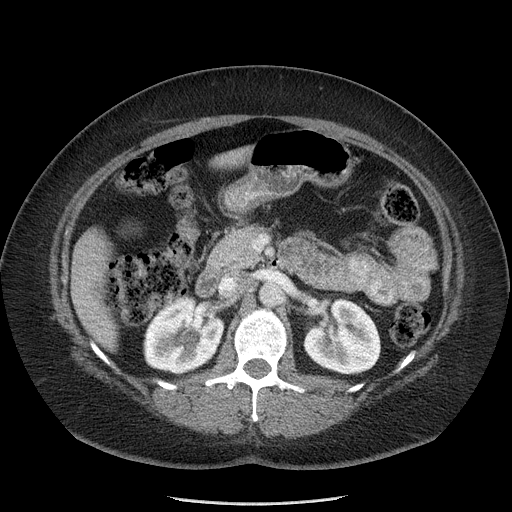
[im 60/96  lung]
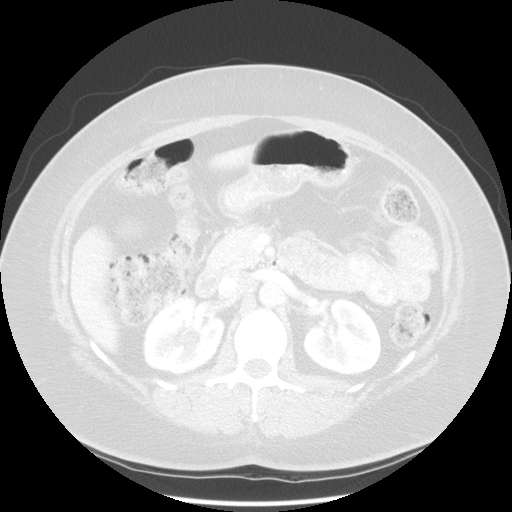
[im 72/96  soft-tissue]
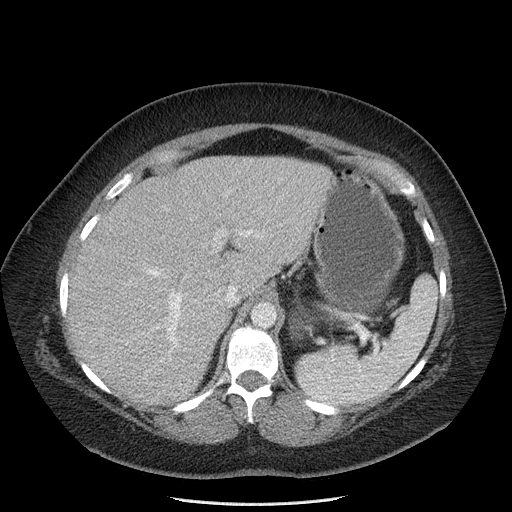
[im 72/96  lung]
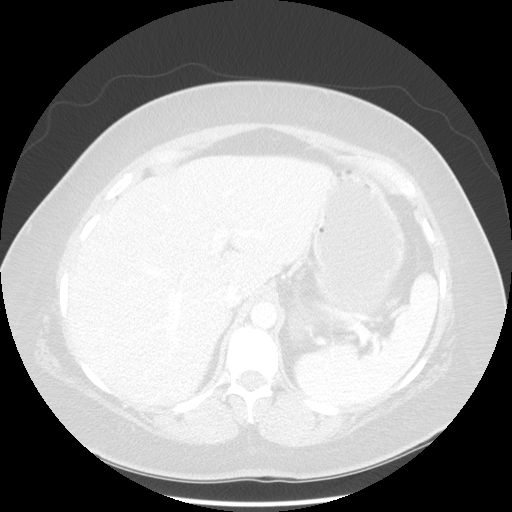
[im 84/96  soft-tissue]
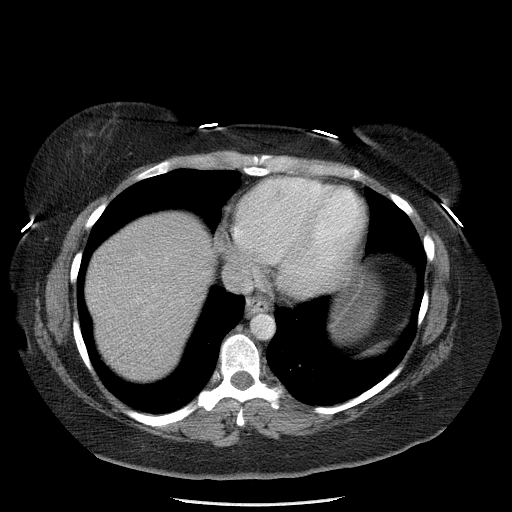
[im 84/96  lung]
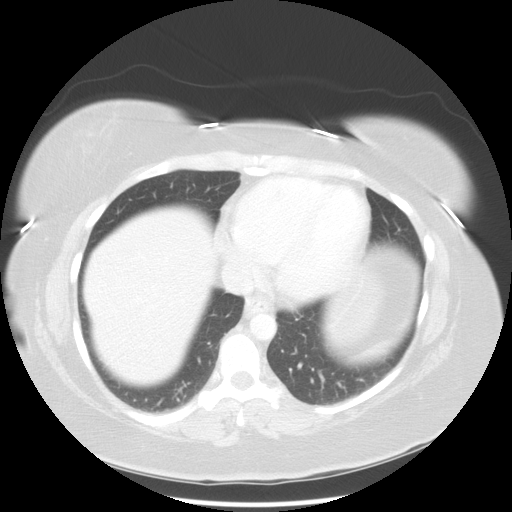

[Series 601: coronal body · coronal · 0.94mm/px · 1 of 145 slices shown, 2 images]
[im 49/145  soft-tissue]
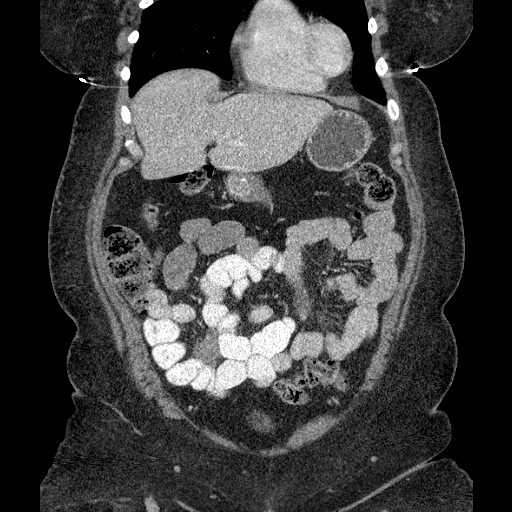
[im 49/145  bone]
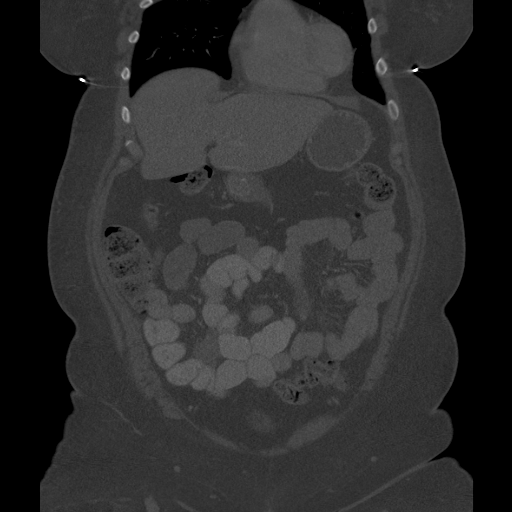

[Series 602: sagittal body · sagittal · 0.94mm/px · 5 of 169 slices shown]
[im 11/169  soft-tissue]
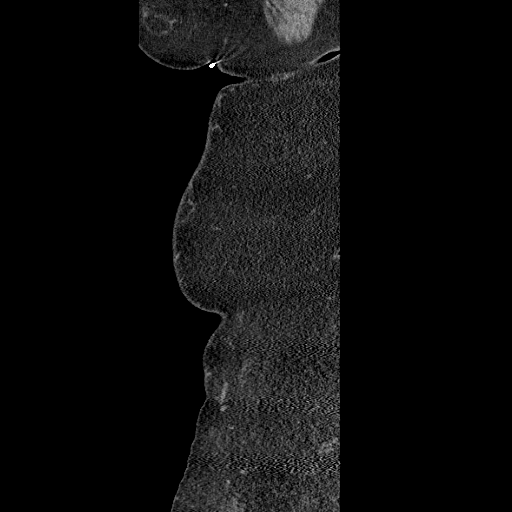
[im 32/169  soft-tissue]
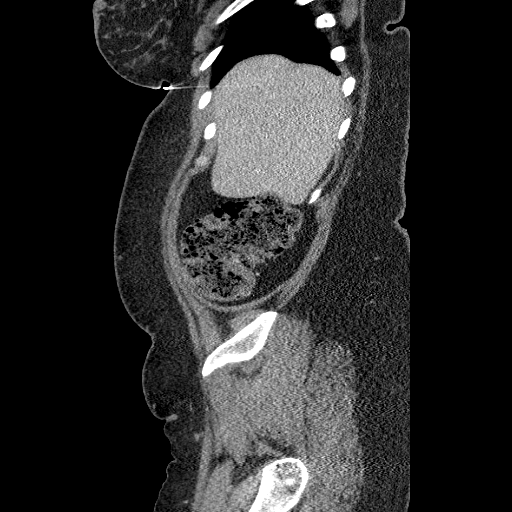
[im 53/169  soft-tissue]
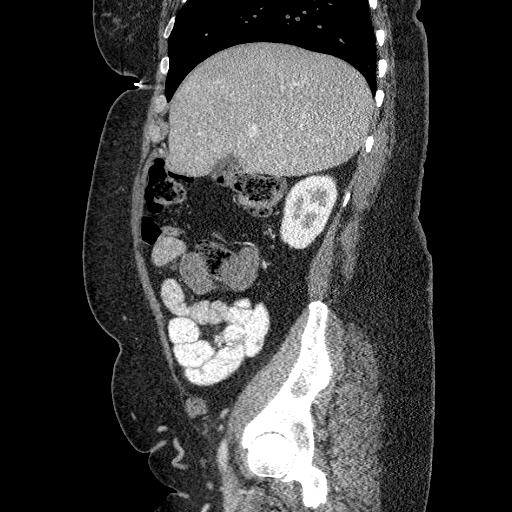
[im 74/169  soft-tissue]
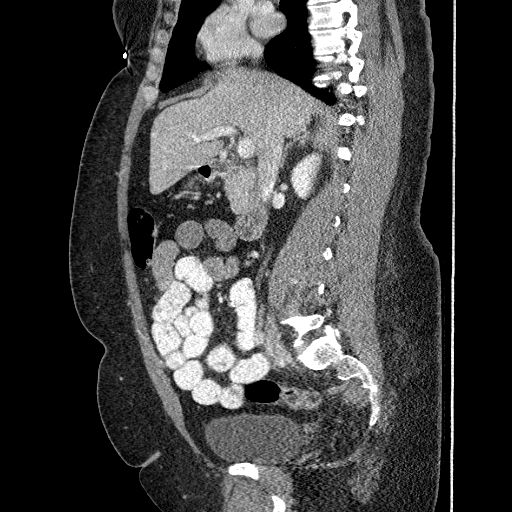
[im 95/169  soft-tissue]
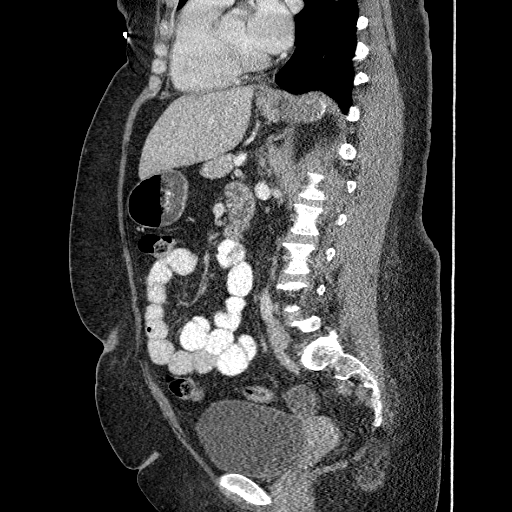

[13 of 36 positions shown; findings below may reference images not displayed]

FINDINGS: Clear lung bases.  Normal heart size without pericardial
or pleural effusion.  Tiny hiatal hernia.

Normal liver, spleen, stomach, pancreas, gallbladder, biliary
tract, adrenal glands.

Normal kidneys. No retroperitoneal or retrocrural adenopathy.

Colonic stool burden suggests constipation.

Normal terminal ileum and appendix.  Normal small bowel without
abdominal ascites.

  No pelvic adenopathy.  Normal urinary bladder.  Left ovarian
follicles, measuring up to 2.3 cm.  Partial hysterectomy.  Right
ovary surgically absent. No significant free fluid.  Subtle fat
containing left inguinal hernia, including on transverse image 91
and coronal image 52.  Scattered tiny sclerotic lesions within the
pelvis, likely bone islands. There may be a prominent disc bulge at
the L4-L5 level.  Sagittal image 85.
IMPRESSION: 1. No acute process in the abdomen or pelvis.
2. Possible constipation.
3.  Low density left ovarian lesions, consistent with follicles.
4.  Fat containing left inguinal hernia.
5.  Tiny hiatal hernia.
6.  Possible L4-L5 large disc bulge.  Suboptimally evaluated.
Given history of "back and left lower quadrant pain," correlation
with lumbar spine symptoms suggested.  The test of choice would be
lumbar spine MR, if this is a concern.

## 2012-08-03 ENCOUNTER — Other Ambulatory Visit: Payer: Self-pay | Admitting: Family Medicine

## 2012-08-03 LAB — COMPLETE METABOLIC PANEL WITH GFR
ALT: 19 U/L (ref 0–35)
AST: 19 U/L (ref 0–37)
Albumin: 4.4 g/dL (ref 3.5–5.2)
Alkaline Phosphatase: 75 U/L (ref 39–117)
BUN: 12 mg/dL (ref 6–23)
CO2: 30 mEq/L (ref 19–32)
Calcium: 9.5 mg/dL (ref 8.4–10.5)
Chloride: 102 mEq/L (ref 96–112)
Creat: 0.84 mg/dL (ref 0.50–1.10)
GFR, Est African American: >89 mL/min
GFR, Est Non African American: 84 mL/min
Glucose, Bld: 104 mg/dL — ABNORMAL HIGH (ref 70–99)
Potassium: 4.6 mEq/L (ref 3.5–5.3)
Sodium: 136 mEq/L (ref 135–145)
Total Bilirubin: 0.5 mg/dL (ref 0.3–1.2)
Total Protein: 6.7 g/dL (ref 6.0–8.3)

## 2012-08-03 LAB — LIPID PANEL
Cholesterol: 241 mg/dL — ABNORMAL HIGH (ref 0–200)
HDL: 42 mg/dL (ref >39)
LDL Cholesterol: 165 mg/dL — ABNORMAL HIGH (ref 0–99)
Total CHOL/HDL Ratio: 5.7 Ratio
Triglycerides: 172 mg/dL — ABNORMAL HIGH (ref <150)
VLDL: 34 mg/dL (ref 0–40)

## 2012-08-03 LAB — HEMOGLOBIN A1C
Hgb A1c MFr Bld: 5.8 % — ABNORMAL HIGH (ref <5.7)
Mean Plasma Glucose: 120 mg/dL — ABNORMAL HIGH (ref <117)

## 2012-08-05 ENCOUNTER — Ambulatory Visit: Payer: 59 | Admitting: Family Medicine

## 2012-08-12 ENCOUNTER — Other Ambulatory Visit (HOSPITAL_BASED_OUTPATIENT_CLINIC_OR_DEPARTMENT_OTHER): Payer: Self-pay | Admitting: INTERNAL MEDICINE

## 2012-08-12 ENCOUNTER — Other Ambulatory Visit: Payer: 59

## 2012-08-18 ENCOUNTER — Ambulatory Visit (INDEPENDENT_AMBULATORY_CARE_PROVIDER_SITE_OTHER): Payer: 59 | Admitting: Family Medicine

## 2012-08-18 VITALS — BP 114/77 | HR 73 | Wt 272.0 lb

## 2012-08-18 DIAGNOSIS — B009 Herpesviral infection, unspecified: Secondary | ICD-10-CM

## 2012-08-18 DIAGNOSIS — R7301 Impaired fasting glucose: Secondary | ICD-10-CM

## 2012-08-18 MED ORDER — CANAGLIFLOZIN 300 MG PO TABS: 1.0000 | ORAL_TABLET | Freq: Every day | ORAL | Status: DC

## 2012-08-18 MED ORDER — VALACYCLOVIR HCL 1 G PO TABS: 1000.0000 mg | ORAL_TABLET | Freq: Every day | ORAL | Status: AC

## 2012-08-18 MED ORDER — METFORMIN HCL ER 500 MG PO TB24: ORAL_TABLET | ORAL | Status: DC

## 2012-08-18 NOTE — Progress Notes (Signed)
  Subjective:    Patient ID: Betty Cooper, female    DOB: 06/15/1965, 47 y.o.   MRN: [id number]  HPI  Betty Cooper is here today to go over her most recent lab results, discuss the conditions listed below and get medication refills.      1)  Impaired Fasting Sugar.  She has done well on her Invokana.  She needs this medication refilled.   2)  HSV:  She has done well with Valtrex for prevention and Famvir/Xerese for outbreaks.  She needs a prescription for her Valtrex.     Review of Systems  Constitutional: Negative for activity change, appetite change, fatigue and unexpected weight change.  HENT: Negative.   Eyes: Negative.   Respiratory: Negative for chest tightness and shortness of breath.   Cardiovascular: Negative for chest pain, palpitations and leg swelling.  Gastrointestinal: Negative for diarrhea and constipation.  Endocrine: Negative.  Negative for polydipsia, polyphagia and polyuria.  Genitourinary: Negative for urgency, frequency and difficulty urinating.  Musculoskeletal: Negative.   Skin: Negative.  Negative for wound.  Neurological: Negative.  Negative for light-headedness and numbness.  Hematological: Negative for adenopathy. Does not bruise/bleed easily.  Psychiatric/Behavioral: Negative for sleep disturbance and dysphoric mood. The patient is not nervous/anxious.    Past Medical History  Diagnosis Date  . Left inguinal hernia 06/25/2011  . Rosacea   . IBS (irritable bowel syndrome)   . HSV-1 infection   . HSV-2 infection   . 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 High risk HPV infection   . Impaired fasting glucose   . Morbid obesity   . Hyperlipidemia   . Varicose vein    Family History  Problem Relation Age of Onset  . Stroke Maternal Grandmother   . Heart disease Maternal Grandmother   . CVA Maternal Grandmother   . Diabetes Maternal Grandmother   . Alzheimer's disease Maternal Grandmother   . Stroke Maternal Grandfather   . Heart  disease Maternal Grandfather   . CVA Maternal Grandfather   . Diabetes Maternal Grandfather   . Hyperlipidemia Mother   . Diabetes Brother   . Hyperlipidemia Brother   . Cancer Paternal Grandmother     Colon Cancer    History   Social History Narrative   Marital Status: Single   Children:  None    Pets: Dogs (Mia, Paramedic)   Living Situation: Lives with dogs.   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 No distress.  HENT:  Head: Normocephalic.  Eyes: No scleral icterus.  Neck: No thyromegaly present.  Cardiovascular: Normal rate, regular rhythm and normal heart sounds.   Pulmonary/Chest: Effort normal and breath sounds normal.  Abdominal: There is no tenderness.  Musculoskeletal: She exhibits no edema and no tenderness.  Neurological: She is alert.  Skin: Skin is warm and dry.  Psychiatric: She has a normal mood and affect. Her behavior is normal. Judgment and thought content normal.          Assessment & Plan:

## 2012-08-22 ENCOUNTER — Ambulatory Visit (HOSPITAL_BASED_OUTPATIENT_CLINIC_OR_DEPARTMENT_OTHER)

## 2012-08-22 ENCOUNTER — Other Ambulatory Visit: Payer: Self-pay | Admitting: Dermatology

## 2012-08-23 ENCOUNTER — Ambulatory Visit (HOSPITAL_BASED_OUTPATIENT_CLINIC_OR_DEPARTMENT_OTHER)
Admission: RE | Admit: 2012-08-23 | Discharge: 2012-08-23 | Disposition: A | Discharge status: Home / Self Care | Payer: Non-veteran care | Source: Ambulatory Visit | Attending: INTERNAL MEDICINE | Admitting: INTERNAL MEDICINE

## 2012-08-23 DIAGNOSIS — Z1231 Encounter for screening mammogram for malignant neoplasm of breast: Secondary | ICD-10-CM | POA: Insufficient documentation

## 2012-08-23 DIAGNOSIS — R922 Inconclusive mammogram: Secondary | ICD-10-CM | POA: Insufficient documentation

## 2012-09-16 ENCOUNTER — Encounter: Payer: Self-pay | Admitting: Family Medicine

## 2012-09-16 DIAGNOSIS — B009 Herpesviral infection, unspecified: Secondary | ICD-10-CM | POA: Insufficient documentation

## 2012-09-16 DIAGNOSIS — R7301 Impaired fasting glucose: Secondary | ICD-10-CM | POA: Insufficient documentation

## 2012-09-16 NOTE — Assessment & Plan Note (Signed)
She was given a prescription for Metformin and Invokana.

## 2012-09-16 NOTE — Assessment & Plan Note (Signed)
Refilled her Valtrex 

## 2012-11-15 ENCOUNTER — Encounter: Payer: Self-pay | Admitting: Family Medicine

## 2012-11-15 ENCOUNTER — Ambulatory Visit (INDEPENDENT_AMBULATORY_CARE_PROVIDER_SITE_OTHER): Payer: 59 | Admitting: Family Medicine

## 2012-11-15 VITALS — BP 141/79 | HR 83 | Wt 270.0 lb

## 2012-11-15 DIAGNOSIS — F411 Generalized anxiety disorder: Secondary | ICD-10-CM

## 2012-11-15 DIAGNOSIS — F3289 Other specified depressive episodes: Secondary | ICD-10-CM

## 2012-11-15 DIAGNOSIS — F329 Major depressive disorder, single episode, unspecified: Secondary | ICD-10-CM

## 2012-11-15 MED ORDER — WELLBUTRIN XL 300 MG PO TB24: 300.0000 mg | ORAL_TABLET | ORAL | Status: DC

## 2012-11-15 MED ORDER — WELLBUTRIN XL 150 MG PO TB24: 150.0000 mg | ORAL_TABLET | Freq: Every day | ORAL | Status: DC

## 2012-11-15 MED ORDER — BUPROPION HCL ER (XL) 300 MG PO TB24: 300.0000 mg | ORAL_TABLET | ORAL | Status: DC

## 2012-11-15 MED ORDER — CLONAZEPAM 1 MG PO TABS: ORAL_TABLET | ORAL | Status: AC

## 2012-11-15 MED ORDER — ESCITALOPRAM OXALATE 10 MG PO TABS: ORAL_TABLET | ORAL | Status: DC

## 2012-11-15 NOTE — Progress Notes (Signed)
  Subjective:    Patient ID: Betty Cooper, female    DOB: 18-May-1965, 47 y.o.   MRN: [id number]  HPI  Betty Cooper is here today to discuss her mood (Depression/Anxiety).  Hospice has been called in for her mother who is terminally ill.  She feels unable to cope and thinks that she would benefit from going on some medication.     Review of Systems  Constitutional: Negative.   HENT: Negative.   Eyes: Negative.   Respiratory: Negative.   Cardiovascular: Negative.   Gastrointestinal: Negative.   Endocrine: Negative.   Genitourinary: Negative.   Musculoskeletal: Negative.   Skin: Negative.   Allergic/Immunologic: Negative.   Neurological: Negative.   Hematological: Negative.   Psychiatric/Behavioral: Positive for sleep disturbance. The patient is nervous/anxious.        Depressed, tearful      Past Medical History  Diagnosis Date  . Left inguinal hernia 06/25/2011  . Rosacea   . IBS (irritable bowel syndrome)   . HSV-1 infection   . HSV-2 infection   . 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 High risk HPV infection   . Impaired fasting glucose   . Morbid obesity   . Hyperlipidemia   . Varicose vein      Past Surgical History  Procedure Laterality Date  . Foot surgery      right foot nerve damage  . Abdominal hysterectomy  1993    Partial hysterectomy  . Tonsillectomy and adenoidectomy  1987  . Nasal sinus surgery    . Inguinal hernia repair  07/10/2011    Procedure: HERNIA REPAIR INGUINAL ADULT;  Surgeon: Wilmon Arms. Corliss Skains, MD;  Location: [facility];  Service: General;  Laterality: Left;  Left Inguinal Hernia Repair with Mesh  . Foot surgery    . Knee surgery      Right Knee  . Laparoscopic unilateral salpingo oopherectomy       Family History  Problem Relation Age of Onset  . Stroke Maternal Grandmother   . Heart disease Maternal Grandmother   . CVA Maternal Grandmother   . Diabetes Maternal Grandmother   . Alzheimer's disease Maternal  Grandmother   . Stroke Maternal Grandfather   . Heart disease Maternal Grandfather   . CVA Maternal Grandfather   . Diabetes Maternal Grandfather   . Hyperlipidemia Mother   . Diabetes Brother   . Hyperlipidemia Brother   . Cancer Paternal Grandmother     Colon Cancer    History   Social History Narrative   Marital Status: Single   Children:  None    Pets: Dogs (Mia, Paramedic)   Living Situation: Lives with dogs.   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 Constitutional: She is oriented to person, place, and time. She appears well-developed and well-nourished.  Cardiovascular: Normal rate and regular rhythm.   Pulmonary/Chest: Effort normal and breath sounds normal.  Neurological: She is alert and oriented to person, place, and time.  Skin: Skin is warm.  Psychiatric:  tearful      Assessment & Plan:

## 2012-11-15 NOTE — Patient Instructions (Addendum)
1)  Mood - Start on 5 mg (1/2 tab)  of Lexapro for at least 2 weeks then increase to 10 mg if needed.  Start on 150 of the Wellbutrin for 7 days then increase to 300 mg.  Take the Klonopin to help you through your mom's death and funeral.    [facility]) [place]   Death and Dying, End-of-Life Care When a patient's health care team determines that a terminal illness can no longer be controlled, medical testing and treatment often stop. But the patient's care continues. The care focuses on making the patient comfortable. The patient receives medications and treatments to control pain and other symptoms, such as constipation, nausea, and shortness of breath. Some patients remain at home during this time, while others enter a hospital or other facility. Either way, services are available to help patients and their families with the medical, psychological, and spiritual issues surrounding dying. A hospice often provides such services.  The time at the end of life is different for each person. Each individual has unique needs for information and support. The patient's and family's questions and concerns about the end of life should be discussed with the health care team as they arise. The following information can help answer some of the questions that many patients, their family members, and caregivers have about the end of life. How long is the patient expected to live? Patients and their family members often want to know how long a person is expected to live. This is a hard question to answer. Factors such as where the disease is located and whether the patient has other illnesses can affect what will happen. Although caregivers may be able to make an estimate based on what they know about the patient, they might be hesitant to do so. Caregivers may be concerned about over- or under-estimating the patient's life span. They also might be fearful of instilling false hope or destroying a  person's hope. When caring for the patient at home, when should the caregiver call for professional help? When caring for a patient at home, there may be times when the caregiver needs assistance from the patient's health care team. A caregiver can contact the patient's doctor or nurse for help in any of the following situations:  The patient is in pain that is not relieved by the prescribed dose of pain medication.  The patient shows discomfort, such as grimacing or moaning.  The patient is having trouble breathing and seems upset.  The patient is unable to urinate or empty the bowels.  The patient has fallen.  The patient is very depressed or talking about committing suicide.  The caregiver has difficulty giving medication to the patient.  The caregiver is overwhelmed by caring for the patient, is too grieved or afraid to be with the patient.  At any time the caregiver does not know how to handle a situation. What are some ways that caregivers can provide emotional comfort to the patient? Everyone has different needs, but some emotions are common to most dying patients. These include fear of abandonment and fear of being a burden. They also have concerns about loss of dignity and loss of control. Some ways caregivers can provide comfort are as follows:  Keep the person company. Talk, watch movies, read, or just be with the person.  Allow the person to express fears and concerns about dying, such as leaving family and friends behind. Be prepared to listen.  Be willing to reminisce about  the person's life.  Avoid withholding difficult information. Most patients prefer to be included in discussions about issues that concern them.  Reassure the patient that you will honor advance directives, such as living wills.  Ask if there is anything you can do.  Respect the person's need for privacy. What are the signs that death is approaching? What can the caregiver do to make the patient  comfortable? Certain signs and symptoms can help a caregiver anticipate when death is near. They are described below, along with suggestions for managing them. It is important to remember that not every patient experiences each of the signs and symptoms. One or more of these symptoms does not always indicate that the patient is close to death. A member of the patient's health care team can give family members and caregivers more information about what to expect.  Drowsiness, increased sleep, or unresponsiveness. This is caused by changes in the patient's metabolism. The caregiver and family members can plan visits and activities for times when the patient is alert. It is important to speak directly to the patient and talk as if the person can hear, even if there is no response. Most patients are still able to hear after they are no longer able to speak. Patients should not be shaken if they do not respond.  Confusion about time, place, and/or identity of loved ones; restlessness; visions of people and places that are not present; pulling at bed linens or clothing (caused in part by changes in the patient's metabolism). Gently remind the patient of the time, date, and people who are with them. If the patient is agitated, do not attempt to restrain the patient. Be calm and reassuring. Speaking calmly may help to re-orient the patient.  Decreased socialization and withdrawal. This is caused by decreased oxygen to the brain, decreased blood flow, and mental preparation for dying.  Speak to the patient directly. Let the patient know you are there for them. The patient may be aware and able to hear, but unable to respond. Professionals advise that giving the patient permission to "let go" can be helpful.  Decreased need for food and fluids, and loss of appetite. This is caused by the body's need to conserve energy and its decreasing ability to use food and fluids properly.  Allow the patient to choose if and  when to eat or drink. Ice chips, water, or juice may be refreshing if the patient can swallow. Keep the patient's mouth and lips moist with products such as glycerin swabs and lip balm.  Loss of bladder or bowel control. This is caused by the relaxing of muscles in the pelvic area.  Keep the patient as clean, dry, and comfortable as possible. Place disposable pads on the bed beneath the patient and remove them when they become soiled.  Darkened urine or decreased amount of urine. This is caused by slowing of kidney function and/or decreased fluid intake.  Caregivers can consult a member of the patient's health care team about the need to insert a catheter to avoid blockage. A member of the health care team can teach the caregiver how to take care of the catheter if one is needed.  Skin becomes cool to the touch, particularly the hands and feet. Skin may become bluish in color, especially on the underside of the body. This is caused by decreased circulation to the extremities.  Blankets can be used to warm the patient. Although the skin may be cool, patients are usually not aware  of feeling cold. Caregivers should avoid warming the patient with electric blankets or heating pads. These can cause burns.  Rattling or gurgling sounds while breathing that may be loud; breathing that is irregular and shallow; decreased number of breaths per minute; breathing that alternates between rapid and slow. This is caused by congestion from decreased fluid consumption, a buildup of waste products in the body, and/or a decrease in circulation to the organs).  Breathing may be easier if the patient's body is turned to the side and pillows are placed beneath the head and behind the back. Although labored breathing can sound very distressing to the caregiver, gurgling and rattling sounds do not cause discomfort to the patient. An external source of oxygen may benefit some patients. If the patient is able to swallow, ice  chips also may help. In addition, a cool mist humidifier may help make the patient's breathing more comfortable.  Turning the head toward a light source. This is caused by decreasing vision.  Leave soft, indirect lights on in the room.  Increased difficulty controlling pain. This is caused by progression of the disease.  It is important to provide pain medications as the patient's doctor has prescribed. The caregiver should contact the doctor if the prescribed dose does not seem adequate. With the help of the health care team, caregivers can also explore methods such as massage and relaxation techniques to help with pain.  Involuntary movements (called myoclonus), changes in heart rate, and loss of reflexes in the legs and arms are also signs that the end of life is near. What are the signs that the patient has died?  There is no breathing or pulse.  The eyes do not move or blink and the pupils are dilated (enlarged). The eyelids may be slightly open.  The jaw is relaxed and the mouth is slightly open.  The body releases the bowel and bladder contents.  The patient does not respond to being touched or spoken to. What needs to be done after the patient has died?  After the patient has passed away, there is no need to hurry with arrangements. Family members and caregivers may wish to sit with the patient, talk, or pray. When the family is ready, the following steps can be taken.  Place the body on its back with one pillow under the head. If necessary, caregivers or family members may wish to put the patient's dentures or other artificial parts in place.  If the patient is in a hospice program, follow the guidelines provided by the program. A caregiver or family member can request a hospice nurse to verify the patient's death.  Contact the appropriate authorities in accordance with local regulations. If the patient has requested not to be resuscitated through a Do Not Resuscitate (DNR)  order or other mechanism, do not call 911.  Contact the patient's doctor and funeral home.  When the patient's family is ready, call other family members, friends, and clergy.  Provide or obtain emotional support for family members and friends to cope with their loss. What additional resources offer information about end-of-life issues? The [employer] ([employer]) provides resources on end-of-life issues. They are available by calling the Cancer Information Service (CIS) at 1-800-4-CANCER 662-193-4587). They can also be accessed on the NCI's website: [employer]. Document Released: 12/03/2007 Document Revised: 05/18/2011 Document Reviewed: 12/03/2007 [facility] Patient Information 2014 [place], [place].  Hospice Care  Hospice is a care service which can be used by people who are terminally ill and  in whom healing is no longer thought possible. Hospice care is for people believed to have no more than 6 months to live. It is meant to help with the two largest fears near the end of life (the fears of dying and of being alone), as well as pain management, and an attempt to allow people to pass away comfortably at home.  Hospice staff:  Administer appropriate pain relief.  Provide nursing care.  Offer reassurance and support to loved ones and family members.  Provide services to keep people comfortable at home or in a hospice facility. Together, you can see to it that your loved one is not alone during this last and important phase of life. You, your family, and your caregivers help you decide when hospice services should begin. If your condition improves or the disease goes into remission, you can be discharged from the hospice program. You can return to hospice care at a later time if needed. The hospice philosophy recognizes death as the final stage of life. It helps patients continue an alert, pain-free life, and manage symptoms while surrounded by their loved ones. Hospice affirms  life without hurrying death. Hospice care treats the person rather than the disease. It emphasizes quality of life with family-centered care. Hospice care involves the patient and family and helps them make decisions.  The care is designed to:  Relieve or decrease pain.  Control other problems.  Provide as much quality time as possible.  Allow people to die with dignity. Unlike other medical care, the focus is no longer on curing disease. The goal of hospice care is to offer as high a quality of life as possible during the end of life. In this way, the last days of life may be spent with dignity.  With hospice care, instead of spending the last weeks or months in a hospital, a person is with loved ones in the home or a homelike setting. About 90 percent of hospice care is provided at home. But hospice is available wherever a person lives, including a nursing home or assisted-living residence. Some residential hospices designed specifically for hospice care also exist. Hospice care is available for many types of terminal illnesses. Hospice services are meant to serve both the patient and family members.  Comfort. In most cases, the individual stays in his or her home or in homelike surroundings instead of in a hospital. The core of hospice is a cooperative effort by family, friends and a team of professional and volunteer caregivers working together to meet your loved one's needs. This team supplies all necessary medicines and equipment. It works with both the person involved and family members to relieve pain and symptoms.  Support. Individuals enjoy the support of loved ones by receiving much of the basic care from family and friends. A nurse may lead the team and coordinates the day-to-day care. A doctor is also part of the team. Chaplains and social workers are available to counsel the family and their loved one. They make sure emotional, spiritual, and social needs are being met. Trained  volunteers perform a wide variety of tasks as needed, such as:  Providing companionship.  Doing light housekeeping.  Preparing meals.  Running errands.  Providing respite for the family.  Improving quality of life. Caring for someone who is dying is emotionally and physically demanding. This can be particularly true for family members who are primary caregivers. But you can take comfort in knowing that hospice is an act of love  that can improve the quality of life for all involved. Professionals are often available to tend to the needs of grieving family members as well.  Spiritual Care. Hospice care emphasizes the spiritual needs of you and your family. People differ in their spiritual needs and religious beliefs so spiritual care is individualized to meet the persons' and family's needs. It may include helping you to look at what death means to you, to say good-bye, or to perform a specific religious ceremony or ritual. HOW TO SELECT A PROGRAM Most hospice programs are run by nonprofit, independent organizations. Some are affiliated with hospitals, nursing homes or home health care agencies. Some are for-profit organizations. You can learn about existing hospice programs in your area from your health caregivers. ASK THE FOLLOWING:  What services are available to the patient?  What services are offered to the family?  Are bereavement services available?  How involved are the family members?  How involved is the doctor?  Who makes up the hospice care team? How are they trained or screened?  How will the individual's pain and symptoms be managed?  If circumstances change, can services be provided in different settings, such as the home or the hospital?  Is the program reviewed and licensed by the state or certified in some other way?  Are all costs covered by insurance? How much you pay for hospice care can vary greatly. It depends on the length and type of care necessary and  your insurance coverage. Medicare and most private insurance plans, including managed care organizations, cover hospice care. Hospice is also covered by Medicaid in most states. Some hospice programs provide services on a sliding fee scale, based on your ability to pay. They may also provide some durable medical equipment for support within the home. Document Released: 06/12/2003 Document Revised: 05/18/2011 Document Reviewed: 02/23/2005 [facility] Patient Information 2014 [place], [place].

## 2013-01-15 DIAGNOSIS — F329 Major depressive disorder, single episode, unspecified: Secondary | ICD-10-CM | POA: Insufficient documentation

## 2013-01-15 DIAGNOSIS — F411 Generalized anxiety disorder: Secondary | ICD-10-CM | POA: Insufficient documentation

## 2013-01-15 DIAGNOSIS — F3289 Other specified depressive episodes: Secondary | ICD-10-CM | POA: Insufficient documentation

## 2013-01-15 NOTE — Assessment & Plan Note (Signed)
She will get started on Lexapro.  She was also given some Klonopin to help her through her mother's funeral whenever she passes away.

## 2013-01-15 NOTE — Assessment & Plan Note (Signed)
Betty Cooper is going to start on Wellbutrin XL 150 mg for a month then will increase to 300 mg.

## 2013-02-08 ENCOUNTER — Encounter: Payer: Self-pay | Admitting: Family Medicine

## 2013-02-08 ENCOUNTER — Ambulatory Visit (INDEPENDENT_AMBULATORY_CARE_PROVIDER_SITE_OTHER): Payer: 59 | Admitting: Family Medicine

## 2013-02-08 ENCOUNTER — Encounter (INDEPENDENT_AMBULATORY_CARE_PROVIDER_SITE_OTHER): Payer: Self-pay

## 2013-02-08 VITALS — BP 127/84 | HR 60 | Resp 16 | Wt 279.0 lb

## 2013-02-08 DIAGNOSIS — F329 Major depressive disorder, single episode, unspecified: Secondary | ICD-10-CM

## 2013-02-08 DIAGNOSIS — F3289 Other specified depressive episodes: Secondary | ICD-10-CM

## 2013-02-08 DIAGNOSIS — F411 Generalized anxiety disorder: Secondary | ICD-10-CM

## 2013-02-08 MED ORDER — VENLAFAXINE HCL ER 150 MG PO CP24: 150.0000 mg | ORAL_CAPSULE | Freq: Every day | ORAL | Status: AC

## 2013-02-08 MED ORDER — VENLAFAXINE HCL ER 75 MG PO CP24: 75.0000 mg | ORAL_CAPSULE | Freq: Every day | ORAL | Status: DC

## 2013-02-08 NOTE — Progress Notes (Signed)
Subjective:    Patient ID: Betty Cooper, female    DOB: 1965/05/11, 47 y.o.   MRN: [id number]  HPI  Betty Cooper is here today to discuss her mood.  In September, Hospice was called in for her mother who is living at [facility] and felt that she needed to be put on medication to help her get through her mother's death.  At that time, she was put on a combination of Wellbutrin XL (150 mg - 300 mg) and Lexapro 10 mg and was given a small amount of Klonopin to have on hand for her mother's funeral.  This combination of medications helped her mood but she ended up stopping both of them because she was having what she describes as "severe" hot flashes.  Since stopping the Wellbutrin and Lexapro, she feels that she has become more anxious, irritable, tearful, sad and depressed and she wants to go back on something for her mood.     Review of Systems  Constitutional: Negative for fatigue and unexpected weight change.  Cardiovascular: Negative for chest pain.  Psychiatric/Behavioral: Positive for sleep disturbance and dysphoric mood. The patient is nervous/anxious.        Irritable, sad, depressed and tearful at times.   All other systems reviewed and are negative.   Past Medical History  Diagnosis Date  . Left inguinal hernia 06/25/2011  . Rosacea   . IBS (irritable bowel syndrome)   . HSV-1 infection   . HSV-2 infection   . 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 High risk HPV infection   . Impaired fasting glucose   . Morbid obesity   . Hyperlipidemia   . Varicose vein      Past Surgical History  Procedure Laterality Date  . Foot surgery      right foot nerve damage  . Abdominal hysterectomy  1993    Partial hysterectomy  . Tonsillectomy and adenoidectomy  1987  . Nasal sinus surgery    . Inguinal hernia repair  07/10/2011    Procedure: HERNIA REPAIR INGUINAL ADULT;  Surgeon: Wilmon Arms. Corliss Skains, MD;  Location: [facility];  Service: General;  Laterality:  Left;  Left Inguinal Hernia Repair with Mesh  . Foot surgery    . Knee surgery      Right Knee  . Laparoscopic unilateral salpingo oopherectomy      Right Ovary      History   Social History Narrative   Marital Status: Single    Children:  None    Pets: Dogs (Mia, Paramedic)   Living Situation: Lives with dogs.   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 Stroke Maternal Grandmother   . Heart disease Maternal Grandmother   . CVA Maternal Grandmother   . Diabetes Maternal Grandmother   . Alzheimer's disease Maternal Grandmother   . Stroke Maternal Grandfather   . Heart disease Maternal Grandfather   . CVA Maternal Grandfather   . Diabetes Maternal Grandfather   . Hyperlipidemia Mother   . Diabetes Brother   . Hyperlipidemia Brother   . Cancer Paternal Grandmother  Colon Cancer     Current Outpatient Prescriptions on File Prior to Visit  Medication Sig Dispense Refill  . clonazePAM (KLONOPIN) 1 MG tablet Take 1 tab up to BID as needed for increased anxiety  30 tablet  0  . famciclovir (FAMVIR) 500 MG tablet Take 500 mg by mouth 3 (three) times daily.      . valACYclovir (VALTREX) 1000 MG tablet Take 1 tablet (1,000 mg total) by mouth daily.  90 tablet  3  . XERESE 5-1 % CREA        No current facility-administered medications on file prior to visit.     Allergies  Allergen Reactions  . Adhesive [Tape]     Surgical GLUE     Immunization History  Administered Date(s) Administered  . Influenza-Unspecified 09/06/2012      Objective:   Physical Exam  Vitals reviewed. Constitutional: She is oriented to person, place, and time. She appears well-nourished. No distress.  Eyes: Conjunctivae are normal. No scleral icterus.  Neck: Neck  supple. No thyromegaly present.  Cardiovascular: Normal rate, regular rhythm and normal heart sounds.   Pulmonary/Chest: Breath sounds normal. No respiratory distress.  Neurological: She is alert and oriented to person, place, and time.  Skin: Skin is warm and dry.  Psychiatric: Her behavior is normal. Judgment and thought content normal.  She is anxious/depressed.         Assessment & Plan:   Betty Cooper was seen today for mood.  Diagnoses and associated orders for this visit:  Anxiety state, unspecified Comments: We had another discussion about options for her mood. She is going to start on 75 of Effexor for a week then increase to 150 mg.   - venlafaxine XR (EFFEXOR XR) 75 MG 24 hr capsule; Take 1 capsule (75 mg total) by mouth daily. - venlafaxine XR (EFFEXOR XR) 150 MG 24 hr capsule; Take 1 capsule (150 mg total) by mouth daily.  Depressive disorder, not elsewhere classified - venlafaxine XR (EFFEXOR XR) 150 MG 24 hr capsule; Take 1 capsule (150 mg total) by mouth daily.

## 2013-02-08 NOTE — Patient Instructions (Signed)
1)  Mood - Take Effexor XR 75 mg for one week then increase to 150 mg.  F/U in 3 months.

## 2013-02-14 ENCOUNTER — Encounter: Payer: Self-pay | Admitting: Family Medicine

## 2013-02-14 ENCOUNTER — Other Ambulatory Visit (HOSPITAL_COMMUNITY)
Admission: RE | Admit: 2013-02-14 | Discharge: 2013-02-14 | Disposition: A | Discharge status: Home / Self Care | Payer: 59 | Source: Ambulatory Visit | Attending: Family Medicine | Admitting: Family Medicine

## 2013-02-14 ENCOUNTER — Ambulatory Visit (INDEPENDENT_AMBULATORY_CARE_PROVIDER_SITE_OTHER): Payer: 59 | Admitting: Family Medicine

## 2013-02-14 VITALS — BP 131/89 | HR 84 | Resp 16 | Ht 68.0 in | Wt 285.0 lb

## 2013-02-14 DIAGNOSIS — Z01419 Encounter for gynecological examination (general) (routine) without abnormal findings: Secondary | ICD-10-CM | POA: Insufficient documentation

## 2013-02-14 DIAGNOSIS — Z0000 Encounter for general adult medical examination without abnormal findings: Secondary | ICD-10-CM

## 2013-02-14 DIAGNOSIS — N3090 Cystitis, unspecified without hematuria: Secondary | ICD-10-CM

## 2013-02-14 DIAGNOSIS — Z124 Encounter for screening for malignant neoplasm of cervix: Secondary | ICD-10-CM

## 2013-02-14 DIAGNOSIS — R300 Dysuria: Secondary | ICD-10-CM

## 2013-02-14 DIAGNOSIS — Z1151 Encounter for screening for human papillomavirus (HPV): Secondary | ICD-10-CM | POA: Insufficient documentation

## 2013-02-14 LAB — POCT URINALYSIS DIPSTICK
Bilirubin, UA: NEGATIVE
Blood, UA: NEGATIVE
Glucose, UA: NEGATIVE
Ketones, UA: NEGATIVE
Nitrite, UA: NEGATIVE
Protein, UA: NEGATIVE
Spec Grav, UA: 1.02
Urobilinogen, UA: NEGATIVE
pH, UA: 7

## 2013-02-14 MED ORDER — CIPROFLOXACIN HCL 500 MG PO TABS: 500.0000 mg | ORAL_TABLET | Freq: Two times a day (BID) | ORAL | Status: AC

## 2013-02-14 MED ORDER — PHENAZOPYRIDINE HCL 200 MG PO TABS: 200.0000 mg | ORAL_TABLET | Freq: Three times a day (TID) | ORAL | Status: DC | PRN

## 2013-02-14 NOTE — Progress Notes (Signed)
Subjective:    Patient ID: Betty Cooper, female    DOB: Dec 26, 1965, 47 y.o.   MRN: [id number]  HPI  Betty Cooper is here today for her annual CPE/PAP. She thinks that she may have a UTI. She has been having some pain with urination for about 2 days.  Her mood has already seem to have improved on the Effexor XR.     Review of Systems  Constitutional: Negative for fatigue and unexpected weight change.  Respiratory: Negative for shortness of breath.   Cardiovascular: Negative for chest pain.  Gastrointestinal: Negative for diarrhea and constipation.  Endocrine: Negative for cold intolerance and heat intolerance.  Genitourinary: Positive for dysuria, urgency and frequency. Negative for hematuria, flank pain, vaginal discharge, genital sores and pelvic pain.  Skin: Negative for rash.  Neurological: Negative.  Negative for weakness, light-headedness and headaches.  Psychiatric/Behavioral: Negative for sleep disturbance. The patient is not nervous/anxious.   All other systems reviewed and are negative.     Past Medical History  Diagnosis Date  . Left inguinal hernia 06/25/2011  . Rosacea   . IBS (irritable bowel syndrome)   . HSV-1 infection   . HSV-2 infection   . 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 High risk HPV infection   . Impaired fasting glucose   . Morbid obesity   . Hyperlipidemia   . Varicose vein      Past Surgical History  Procedure Laterality Date  . Foot surgery      right foot nerve damage  . Tonsillectomy and adenoidectomy  1987  . Nasal sinus surgery    . Inguinal hernia repair  07/10/2011    Procedure: HERNIA REPAIR INGUINAL ADULT;  Surgeon: Wilmon Arms. Corliss Skains, MD;  Location: [facility];  Service: General;  Laterality: Left;  Left Inguinal Hernia Repair with Mesh  . Foot surgery    . Knee surgery      Right Knee  . Laparoscopic unilateral salpingo oopherectomy      Right Ovary   . Abdominal hysterectomy  1993    "Partial" Cervix is still  present.       History   Social History Narrative   Marital Status: Single    Children:  None    Pets: Dogs (Mia, Paramedic)   Living Situation: Lives with dogs.   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 Stroke Maternal Grandmother   . Heart disease Maternal Grandmother   . CVA Maternal Grandmother   . Diabetes Maternal Grandmother   . Alzheimer's disease Maternal Grandmother   . Stroke Maternal Grandfather   . Heart disease Maternal Grandfather   . CVA Maternal Grandfather   . Diabetes Maternal Grandfather   . Hyperlipidemia Mother   . Diabetes Brother   . Hyperlipidemia Brother   . Cancer Paternal Grandmother     Colon Cancer     Current Outpatient Prescriptions on File Prior to Visit  Medication Sig Dispense Refill  . clonazePAM (KLONOPIN) 1 MG tablet Take 1 tab up to BID as needed for increased anxiety  30 tablet  0  . famciclovir (FAMVIR)  500 MG tablet Take 500 mg by mouth 3 (three) times daily.      . valACYclovir (VALTREX) 1000 MG tablet Take 1 tablet (1,000 mg total) by mouth daily.  90 tablet  3  . venlafaxine XR (EFFEXOR XR) 150 MG 24 hr capsule Take 1 capsule (150 mg total) by mouth daily.  90 capsule  0  . XERESE 5-1 % CREA        No current facility-administered medications on file prior to visit.     Allergies  Allergen Reactions  . Adhesive [Tape]     Surgical GLUE     Immunization History  Administered Date(s) Administered  . Influenza-Unspecified 09/06/2012       Objective:   Physical Exam  Constitutional: She is oriented to person, place, and time. She appears well-developed and well-nourished.  HENT:  Head: Normocephalic and atraumatic.  Right Ear: External ear normal.  Left Ear: External ear normal.  Nose:  Nose normal.  Mouth/Throat: Oropharynx is clear and moist.  Eyes: Conjunctivae and EOM are normal. Pupils are equal, round, and reactive to light. No scleral icterus.  Neck: Normal range of motion. Neck supple. No thyromegaly present.  Cardiovascular: Normal rate, regular rhythm, normal heart sounds and intact distal pulses.  Exam reveals no gallop and no friction rub.   No murmur heard. Pulmonary/Chest: Effort normal and breath sounds normal. Right breast exhibits no inverted nipple, no mass, no nipple discharge, no skin change and no tenderness. Left breast exhibits no inverted nipple, no mass, no nipple discharge, no skin change and no tenderness. Breasts are symmetrical.  Abdominal: Soft. Bowel sounds are normal. Hernia confirmed negative in the right inguinal area and confirmed negative in the left inguinal area.  Genitourinary: Rectum normal and vagina normal. Pelvic exam was performed with patient supine. There is no rash, tenderness or lesion on the right labia. There is no rash, tenderness or lesion on the left labia. Cervix exhibits no motion tenderness, no discharge and no friability. Right adnexum displays no mass (She has had her left ovary removed.  ), no tenderness and no fullness. Left adnexum displays no mass, no tenderness and no fullness. No vaginal discharge found.  Musculoskeletal: Normal range of motion. She exhibits no edema and no tenderness.  Lymphadenopathy:    She has no cervical adenopathy.       Right: No inguinal adenopathy present.       Left: No inguinal adenopathy present.  Neurological: She is alert and oriented to person, place, and time. She has normal reflexes.  Skin: Skin is warm and dry.  Psychiatric: She has a normal mood and affect. Her behavior is normal. Judgment and thought content normal.      Assessment & Plan:   Betty Cooper was seen today for a CPE/PAP and UTI.    Diagnoses and associated orders for this visit:  Routine general medical examination at  a health care facility Comments: Normal Exam - POCT urinalysis dipstick  Screening for malignant neoplasm of the cervix Comments: Sent off a pap to check for HPV.  Her pap result was read as CIN1/HPV (LSIL) even though the "co-testing" came back negative for HPV.  According to the new guidelines, we'll repeat her pap in a year with co-testing again.   - Cytology - PAP   Dysuria Comments: U/A suggest an infection so we sent off a culture.   - POCT urinalysis dipstick - Urine Culture  Cystitis Comments: She was given Cipro and her culture did grow >  100,000 colonies of E. coli which was sensitive to Cipro.   - ciprofloxacin (CIPRO) 500 MG tablet; Take 1 tablet (500 mg total) by mouth 2 (two) times daily. - Discontinue: phenazopyridine (PYRIDIUM) 200 MG tablet; Take 1 tablet (200 mg total) by mouth 3 (three) times daily as needed for pain.   TIME SPENT "FACE TO FACE" WITH PATIENT -  45 MINS

## 2013-02-16 LAB — URINE CULTURE: Colony Count: >=100000

## 2013-03-17 ENCOUNTER — Ambulatory Visit (INDEPENDENT_AMBULATORY_CARE_PROVIDER_SITE_OTHER): Payer: 59 | Admitting: Family Medicine

## 2013-03-17 ENCOUNTER — Encounter: Payer: Self-pay | Admitting: Family Medicine

## 2013-03-17 VITALS — BP 127/87 | HR 72 | Resp 16 | Wt 276.0 lb

## 2013-03-17 DIAGNOSIS — G2581 Restless legs syndrome: Secondary | ICD-10-CM

## 2013-03-17 DIAGNOSIS — F3289 Other specified depressive episodes: Secondary | ICD-10-CM

## 2013-03-17 DIAGNOSIS — F329 Major depressive disorder, single episode, unspecified: Secondary | ICD-10-CM

## 2013-03-17 MED ORDER — BUPROPION HCL ER (XL) 300 MG PO TB24: 300.0000 mg | ORAL_TABLET | ORAL | Status: AC

## 2013-03-17 MED ORDER — BUPROPION HCL ER (XL) 150 MG PO TB24: 150.0000 mg | ORAL_TABLET | ORAL | Status: DC

## 2013-03-17 NOTE — Progress Notes (Signed)
Subjective:    Patient ID: Betty Cooper, female    DOB: 05/28/1965, 48 y.o.   MRN: [id number]  HPI  Betty Cooper is here today to discuss her treatment for her anxiety/depression.  She has been taking Effexor 150 mg for the past month and feels that it has worked great for her mood (depression/anxiety).  Her only concern/issue is that since going up to the 150 mg, she has been having what she describes as being "severe" restless leg symptoms and she can not sleep.  Back in September when we put her on medication for her mood (Wellbutrin/Lexapro), hospice has just been called in for her mother.  At that time, Betty Cooper was under the impression that her mom only had days to live so she was pretty distraught.  Her mom's condition really has not improved but she is still hanging on.  Betty Cooper does not feel as distraught as she was in September and feels that she is now ready for her mom to go to end her suffering.  She does however feel that she does need to still be on something for her mood but does not think Effexor is the answer if it is going to give her restless leg.        Review of Systems  Musculoskeletal:       Restless Legs   Psychiatric/Behavioral: The patient is not nervous/anxious.        Improved.      Past Medical History  Diagnosis Date  . Left inguinal hernia 06/25/2011  . Rosacea   . IBS (irritable bowel syndrome)   . HSV-1 infection   . HSV-2 infection   . 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 High risk HPV infection   . Impaired fasting glucose   . Morbid obesity   . Hyperlipidemia   . Varicose vein      Past Surgical History  Procedure Laterality Date  . Foot surgery      right foot nerve damage  . Tonsillectomy and adenoidectomy  1987  . Nasal sinus surgery    . Inguinal hernia repair  07/10/2011    Procedure: HERNIA REPAIR INGUINAL ADULT;  Surgeon: Wilmon Arms. Corliss Skains, MD;  Location: [facility];  Service: General;  Laterality: Left;  Left Inguinal  Hernia Repair with Mesh  . Foot surgery    . Knee surgery      Right Knee  . Laparoscopic unilateral salpingo oopherectomy      Right Ovary   . Abdominal hysterectomy  1993    "Partial" Cervix is still present.       History   Social History Narrative   Marital Status: Single    Children:  None    Pets: Dogs (Mia, Paramedic)   Living Situation: Lives with dogs.   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 Stroke Maternal Grandmother   . Heart disease Maternal Grandmother   . CVA Maternal Grandmother   . Diabetes Maternal Grandmother   . Alzheimer's disease Maternal Grandmother   . Stroke Maternal Grandfather   . Heart disease Maternal Grandfather   .  CVA Maternal Grandfather   . Diabetes Maternal Grandfather   . Hyperlipidemia Mother   . Diabetes Brother   . Hyperlipidemia Brother   . Cancer Paternal Grandmother     Colon Cancer     Current Outpatient Prescriptions on File Prior to Visit  Medication Sig Dispense Refill  . clonazePAM (KLONOPIN) 1 MG tablet Take 1 tab up to BID as needed for increased anxiety  30 tablet  0  . valACYclovir (VALTREX) 1000 MG tablet Take 1 tablet (1,000 mg total) by mouth daily.  90 tablet  3  . venlafaxine XR (EFFEXOR XR) 150 MG 24 hr capsule Take 1 capsule (150 mg total) by mouth daily.  90 capsule  0  . XERESE 5-1 % CREA       . famciclovir (FAMVIR) 500 MG tablet Take 500 mg by mouth 3 (three) times daily.       No current facility-administered medications on file prior to visit.     Allergies  Allergen Reactions  . Adhesive [Tape]     Surgical GLUE     Immunization History  Administered Date(s) Administered  . Influenza-Unspecified 09/06/2012       Objective:   Physical Exam  Constitutional: She  is oriented to person, place, and time. She appears well-nourished. No distress.  Eyes: Conjunctivae are normal. No scleral icterus.  Neck: Neck supple. No thyromegaly present.  Cardiovascular: Normal rate, regular rhythm and normal heart sounds.   Pulmonary/Chest: Breath sounds normal. No respiratory distress.  Neurological: She is alert and oriented to person, place, and time.  Skin: Skin is warm and dry.  Psychiatric: She has a normal mood and affect. Her behavior is normal. Judgment and thought content normal.  She appears to be in good spirits showing no sign of depression or anxiety.        Assessment & Plan:    Betty Cooper was seen today for restless leg.  Diagnoses and associated orders for this visit:  Depressive disorder, not elsewhere classified Comments: We had a long discussion about medication choices again comparing the benefits of a SSRI vs Wellbutrin.  At this point, she feels that she needs the benefits from Wellbutrin more that she needs the Lexapro.  I am not sure about this and think that it is hard for her to judge since she is on Effexor and feeling great as far as her mood is concerned. We discussed how she is to wean herself down from the Effexor and on to the Wellbutrin. She has some Lexapro left and may add it if she become more emotional.  She says that she would rather have the hot flashes than the restless leg symptoms.    - buPROPion (WELLBUTRIN XL) 150 MG 24 hr tablet; Take 1 tablet (150 mg total) by mouth every morning. - buPROPion (WELLBUTRIN XL) 300 MG 24 hr tablet; Take 1 tablet (300 mg total) by mouth every morning.  Restless leg Comments: We'll see if this improves once she gets off of the Effexor.  She is to take a Klonopin on the days she takes the Effexor as she weans off of it onto the Wellbutrin.

## 2013-03-18 ENCOUNTER — Encounter: Payer: Self-pay | Admitting: Family Medicine

## 2013-03-18 DIAGNOSIS — R300 Dysuria: Secondary | ICD-10-CM | POA: Insufficient documentation

## 2013-03-18 DIAGNOSIS — N3090 Cystitis, unspecified without hematuria: Secondary | ICD-10-CM | POA: Insufficient documentation

## 2013-03-18 DIAGNOSIS — Z124 Encounter for screening for malignant neoplasm of cervix: Secondary | ICD-10-CM | POA: Insufficient documentation

## 2013-03-18 DIAGNOSIS — Z0000 Encounter for general adult medical examination without abnormal findings: Secondary | ICD-10-CM | POA: Insufficient documentation

## 2013-03-18 NOTE — Patient Instructions (Signed)
1)  Mood - Alternate the Effexor 150 with Wellbutrin 150 mg every other day for 4 days then take Wellbutrin for 2 day and then 1 Effexor for the next 4 days and then increase the Wellbutrin to 300 mg daily and stop the Effexor.  If you become more emotional, you can add back the Lexapro (1/4-1/2 tab).  If the hot flashes return, we might try a combination of Effexor 75 mg with the Wellbutrin.    2)  Pap Smear - Your result came back as LSIL which is a "low-grade" abnormality.  The interesting thing is that you tested negative for HPV.  The "preferred" follow up according to the new guidelines say that you don't need anything else at this time.  We are to recheck your pap in 1 year and repeat the co-testing for HPV.     Papanicolaou Test This is a test which is used to screen for cancer of the cervix. In this test, cells from the cervix are removed and are examined under a microscope by a pathologist (a specialist in looking at body tissues). The cells are removed from the cervix by a simple scraping or brushing of the cervix and the insides of the cervix. This is a very accurate test in detecting cervical cancer and has been very helpful in decreasing deaths from this disease. Pap tests are reported in terms of cervical intraepithelial neoplasia (CIN). Cervix means in the cervix; intraepithelial means changes in the cells; and neoplasia means new growth. The sub classes of CIN are:   CIN 1: mild and mild-to-moderate dysplasia (abnormal tissue)  CIN 2: moderate and moderate-to-severe dysplasia  CIN 3: severe dysplasia and carcinoma (malignant tumor) in process. The Pap test, when performed routinely, has been a great help in the early detection of cervical cancer, which is treatable if caught at an early stage. The Pap test is also used to monitor any abnormalities or unusual findings. In many cases, these findings are part of a body's repair process and often resolve themselves without any further  treatment. If you douche, tub-bathe, or use vaginal creams 48 - 72 hours prior to the examination, your test results might be "unsatisfactory." PREPARATION FOR TEST The test should be done when you are not having a period or vaginal bleeding. NORMAL FINDINGS No abnormal or atypical cells. Ranges for normal findings may vary among different laboratories and hospitals. You should always check with your doctor after having lab work or other tests done to discuss the meaning of your test results and whether your values are considered within normal limits. MEANING OF TEST  Your caregiver will go over the test results with you and discuss the importance and meaning of your results, as well as treatment options and the need for additional tests if necessary. OBTAINING THE TEST RESULTS  It is your responsibility to obtain your test results. Ask the lab or department performing the test when and how you will get your results. Document Released: 03/28/2004 Document Revised: 05/18/2011 Document Reviewed: 02/06/2008 [place] Patient Information 2014 [place], [place].

## 2013-03-28 ENCOUNTER — Ambulatory Visit (INDEPENDENT_AMBULATORY_CARE_PROVIDER_SITE_OTHER): Payer: 59 | Admitting: Family Medicine

## 2013-03-28 ENCOUNTER — Encounter: Payer: Self-pay | Admitting: Family Medicine

## 2013-03-28 VITALS — BP 137/84 | HR 93 | Temp 98.1°F | Resp 16 | Wt 278.0 lb

## 2013-03-28 DIAGNOSIS — R059 Cough, unspecified: Secondary | ICD-10-CM

## 2013-03-28 DIAGNOSIS — R05 Cough: Secondary | ICD-10-CM

## 2013-03-28 MED ORDER — HYDROCODONE-HOMATROPINE 5-1.5 MG/5ML PO SYRP: 5.0000 mL | ORAL_SOLUTION | Freq: Four times a day (QID) | ORAL | Status: DC | PRN

## 2013-03-28 MED ORDER — HYDROCOD POLST-CHLORPHEN POLST 10-8 MG/5ML PO LQCR: 5.0000 mL | Freq: Two times a day (BID) | ORAL | Status: DC | PRN

## 2013-03-28 MED ORDER — AZITHROMYCIN 500 MG PO TABS: 500.0000 mg | ORAL_TABLET | Freq: Every day | ORAL | Status: AC

## 2013-03-28 MED ORDER — DEXAMETHASONE 1.5 MG PO KIT: PACK | ORAL | Status: DC

## 2013-03-28 NOTE — Progress Notes (Signed)
Subjective:    Patient ID: Betty Cooper, female    DOB: 1965/05/23, 48 y.o.   MRN: [id number]  Betty Cooper is here today complaining of URI symptoms.   URI  This is a recurrent problem. The current episode started 1 to 4 weeks ago. The problem has been gradually worsening. Maximum temperature: 99.6. The fever has been present for less than 1 day. Associated symptoms include congestion, coughing, headaches and rhinorrhea. Treatments tried: Hydrocodone, Tussionex, Tessalon Perles, Mucinex  The treatment provided no relief.    Review of Systems  Constitutional: Positive for fatigue.  HENT: Positive for congestion and rhinorrhea.   Respiratory: Positive for cough and chest tightness.   Neurological: Positive for headaches.     Past Medical History  Diagnosis Date  . Left inguinal hernia 06/25/2011  . Rosacea   . IBS (irritable bowel syndrome)   . HSV-1 infection   . HSV-2 infection   . 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 High risk HPV infection   . Impaired fasting glucose   . Morbid obesity   . Hyperlipidemia   . Varicose vein      Past Surgical History  Procedure Laterality Date  . Foot surgery      right foot nerve damage  . Tonsillectomy and adenoidectomy  1987  . Nasal sinus surgery    . Inguinal hernia repair  07/10/2011    Procedure: HERNIA REPAIR INGUINAL ADULT;  Surgeon: Imogene Burn. Georgette Dover, MD;  Location: [facility];  Service: General;  Laterality: Left;  Left Inguinal Hernia Repair with Mesh  . Foot surgery    . Knee surgery      Right Knee  . Laparoscopic unilateral salpingo oopherectomy      Right Ovary   . Abdominal hysterectomy  1993    "Partial" Cervix is still present.       History   Social History Narrative   Marital Status: Single    Children:  None    Pets: Dogs (Mia, Psychiatrist)   Living Situation: Lives with dogs.   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 Stroke Maternal Grandmother   . Heart disease Maternal Grandmother   . CVA Maternal Grandmother   . Diabetes Maternal Grandmother   . Alzheimer's disease Maternal Grandmother   . Stroke Maternal Grandfather   . Heart disease Maternal Grandfather   . CVA Maternal Grandfather   . Diabetes Maternal Grandfather   . Hyperlipidemia Mother   . Diabetes Brother   . Hyperlipidemia Brother   . Cancer Paternal Grandmother     Colon Cancer     Current Outpatient Prescriptions on File Prior to Visit  Medication Sig Dispense Refill  . buPROPion (WELLBUTRIN XL) 300 MG 24 hr tablet Take 1 tablet (300 mg total) by mouth every morning.  90 tablet  1  . clonazePAM (KLONOPIN) 1 MG tablet Take 1 tab up to BID as needed for increased anxiety  30 tablet  0  . famciclovir (FAMVIR) 500 MG tablet Take 500 mg by mouth 3 (three) times daily.      . valACYclovir (VALTREX)  1000 MG tablet Take 1 tablet (1,000 mg total) by mouth daily.  90 tablet  3  . venlafaxine XR (EFFEXOR XR) 150 MG 24 hr capsule Take 1 capsule (150 mg total) by mouth daily.  90 capsule  0  . XERESE 5-1 % CREA        No current facility-administered medications on file prior to visit.     Allergies  Allergen Reactions  . Adhesive [Tape]     Surgical GLUE     Immunization History  Administered Date(s) Administered  . Influenza-Unspecified 09/06/2012      Objective:   Physical Exam  Constitutional: She appears well-nourished. No distress.  HENT:  Head: Normocephalic.  Mouth/Throat: No oropharyngeal exudate.  Eyes: Conjunctivae are normal. Right eye exhibits no discharge. Left eye exhibits no discharge.  Neck: Neck supple.  Cardiovascular: Normal rate, regular rhythm and normal heart sounds.  Exam reveals no gallop and no friction rub.   No murmur  heard. Pulmonary/Chest: Effort normal and breath sounds normal. She has no wheezes. She exhibits no tenderness.  Lymphadenopathy:    She has no cervical adenopathy.  Neurological: She is alert.  Skin: Skin is warm and dry. No rash noted.  Psychiatric: She has a normal mood and affect.      Assessment & Plan:    [name] was seen today for uri.  Diagnoses and associated orders for this visit:  Cough - azithromycin (ZITHROMAX) 500 MG tablet; Take 1 tablet (500 mg total) by mouth daily. Take 1 tablet daily for 3 days. - chlorpheniramine-HYDROcodone (TUSSIONEX PENNKINETIC ER) 10-8 MG/5ML LQCR; Take 5 mLs by mouth every 12 (twelve) hours as needed. - HYDROcodone-homatropine (HYCODAN) 5-1.5 MG/5ML syrup; Take 5 mLs by mouth every 6 (six) hours as needed. - Dexamethasone (DEXPAK 6 DAY) 1.5 MG KIT; Take as according to pack

## 2013-03-28 NOTE — Patient Instructions (Signed)
1)  Chest Congestion -  Mucinex DM 1200/60; Tessalon  Perles, Hydrocodone Syrup; [place]cka Cold Care; Zithromax 500 mg for 3 days; Dexpak 6 days

## 2013-04-27 ENCOUNTER — Other Ambulatory Visit: Payer: Self-pay | Admitting: Family Medicine

## 2013-04-27 DIAGNOSIS — Z1231 Encounter for screening mammogram for malignant neoplasm of breast: Secondary | ICD-10-CM

## 2013-05-03 ENCOUNTER — Ambulatory Visit (HOSPITAL_BASED_OUTPATIENT_CLINIC_OR_DEPARTMENT_OTHER)
Admission: RE | Admit: 2013-05-03 | Discharge: 2013-05-03 | Disposition: A | Discharge status: Home / Self Care | Payer: 59 | Source: Ambulatory Visit | Attending: Family Medicine | Admitting: Family Medicine

## 2013-05-03 DIAGNOSIS — Z1231 Encounter for screening mammogram for malignant neoplasm of breast: Secondary | ICD-10-CM

## 2013-06-08 ENCOUNTER — Other Ambulatory Visit: Payer: Self-pay | Admitting: Dermatology

## 2013-09-26 ENCOUNTER — Ambulatory Visit (INDEPENDENT_AMBULATORY_CARE_PROVIDER_SITE_OTHER): Payer: 59 | Admitting: Family Medicine

## 2013-09-26 VITALS — BP 133/85 | HR 83 | Resp 16 | Wt 290.0 lb

## 2013-09-26 DIAGNOSIS — R300 Dysuria: Secondary | ICD-10-CM

## 2013-09-26 DIAGNOSIS — N3090 Cystitis, unspecified without hematuria: Secondary | ICD-10-CM

## 2013-09-26 DIAGNOSIS — F329 Major depressive disorder, single episode, unspecified: Secondary | ICD-10-CM

## 2013-09-26 DIAGNOSIS — F3289 Other specified depressive episodes: Secondary | ICD-10-CM

## 2013-09-26 DIAGNOSIS — F32A Depression, unspecified: Secondary | ICD-10-CM

## 2013-09-26 DIAGNOSIS — N318 Other neuromuscular dysfunction of bladder: Secondary | ICD-10-CM

## 2013-09-26 DIAGNOSIS — N3281 Overactive bladder: Secondary | ICD-10-CM

## 2013-09-26 MED ORDER — PHENAZOPYRIDINE HCL 200 MG PO TABS: 200.0000 mg | ORAL_TABLET | Freq: Three times a day (TID) | ORAL | Status: AC

## 2013-09-26 MED ORDER — ESCITALOPRAM OXALATE 10 MG PO TABS: ORAL_TABLET | ORAL | Status: AC

## 2013-09-26 MED ORDER — TOLTERODINE TARTRATE ER 4 MG PO CP24: 4.0000 mg | ORAL_CAPSULE | Freq: Every day | ORAL | Status: AC

## 2013-09-26 MED ORDER — MIRABEGRON ER 50 MG PO TB24: 50.0000 mg | ORAL_TABLET | Freq: Every day | ORAL | Status: AC

## 2013-09-26 NOTE — Patient Instructions (Addendum)
1)  Urinary Symptoms - We are sending off a culture to make sure that you are not growing a bacteria in your bladder.  For now, let's go with the irritation theory so you will take the Pyridium 3 times per day for a week and add you some Tums to help neutralize the urine.  If you continue to have the overactivity then you can try the samples of Myrbetriq 50 mg daily.  If you see that this works great then you can fill either the Myrbetriq vs the Detrol LA 4 pm daily.    2)  Mood - Take 1/2 of the Lexapro for a week or so in the am  before deciding to increase to 1 tab. You might for a short time need the whole tab but once things calm down then back down to 1/2 if you can.  You can add the Klonopin at times of severe anxiety no more than twice in a day.     Overactive Bladder, Adult The bladder has two functions that are totally opposite of the other. One is to relax and stretch out so it can store urine (fills like a balloon), and the other is to contract and squeeze down so that it can empty the urine that it has stored. Proper functioning of the bladder is a complex mixing of these two functions. The filling and emptying of the bladder can be influenced by:  The bladder.  The spinal cord.  The brain.  The nerves going to the bladder.  Other organs that are closely related to the bladder such as prostate in males and the vagina in females. As your bladder fills with urine, nerve signals are sent from the bladder to the brain to tell you that you may need to urinate. Normal urination requires that the bladder squeeze down with sufficient strength to empty the bladder, but this also requires that the bladder squeeze down sufficiently long to finish the job. In addition the sphincter muscles, which normally keep you from leaking urine, must also relax so that the urine can pass. Coordination between the bladder muscle squeezing down and the sphincter muscles relaxing is required to make everything  happen normally. With an overactive bladder sometimes the muscles of the bladder contract unexpectedly and involuntarily and this causes an urgent need to urinate. The normal response is to try to hold urine in by contracting the sphincter muscles. Sometimes the bladder contracts so strongly that the sphincter muscles cannot stop the urine from passing out and incontinence occurs. This kind of incontinence is called urge incontinence. Having an overactive bladder can be embarrassing and awkward. It can keep you from living life the way you want to. Many people think it is just something you have to put up with as you grow older or have certain health conditions. In fact, there are treatments that can help make your life easier and more pleasant. CAUSES  Many things can cause an overactive bladder. Possibilities include:  Urinary tract infection or infection of nearby tissues such as the prostate.  Prostate enlargement.  In women, multiple pregnancies or surgery on the uterus or urethra.  Bladder stones, inflammation or tumors.  Caffeine.  Alcohol.  Medications. For example, diuretics (drugs that help the body get rid of extra fluid) increase urine production. Some other medicines must be taken with lots of fluids.  Muscle or nerve weakness. This might be the result of a spinal cord injury, a stroke, multiple sclerosis or Parkinson's disease.  Diabetes can cause a high urine volume which fills the bladder so quickly that the normal urge to urinate is triggered very strongly. SYMPTOMS   Loss of bladder control. You feel the need to urinate and cannot make your body wait.  Sudden, strong urges to urinate.  Urinating 8 or more times a day.  Waking up to urinate two or more times a night. DIAGNOSIS  To decide if you have overactive bladder, your healthcare provider will probably:  Ask about symptoms you have noticed.  Ask about your overall health. This will include questions about  any medications you are taking.  Do a physical examination. This will help determine if there are obvious blockages or other problems.  Order some tests. These might include:  A blood test to check for diabetes or other health issues that could be contributing to the problem.  Urine testing. This could measure the flow of urine and the pressure on the bladder.  A test of your neurological system (the brain, spinal cord and nerves). This is the system that senses the need to urinate. Some of these tests are called flow tests, bladder pressure tests and electrical measurements of the sphincter muscle.  A bladder test to check whether it is emptying completely when you urinate.  Cytoscopy. This test uses a thin tube with a tiny camera on it. It offers a look inside your urethra and bladder to see if there are problems.  Imaging tests. You might be given a contrast dye and then asked to urinate. X-rays are taken to see how your bladder is working. TREATMENT  An overactive bladder can be treated in many ways. The treatment will depend on the cause. Whether you have a mild or severe case also makes a difference. Often, treatment can be given in your healthcare provider's office or clinic. Be sure to discuss the different options with your caregiver. They include:  Behavioral treatments. These do not involve medication or surgery:  Bladder training. For this, you would follow a schedule to urinate at regular intervals. This helps you learn to control the urge to urinate. At first, you might be asked to wait a few minutes after feeling the urge. In time, you should be able to schedule bathroom visits an hour or more apart.  Kegel exercises. These exercises strengthen the pelvic floor muscles, which support the bladder. By toning these muscles, they can help control urination, even if the bladder muscles are overactive. A specialist will teach you how to do these exercises correctly. They will require  daily practice.  Weight loss. If you are obese or overweight, losing weight might stop your bladder from being overactive. Talk to your healthcare provider about how many pounds you should lose. Also ask if there is a specific program or method that would work best for you.  Diet change. This might be suggested if constipation is making your overactive bladder worse. Your healthcare provider or a nutritionist can explain ways to change what you eat to ease constipation. Other people might need to take in less caffeine or alcohol. Sometimes drinking fewer fluids is needed, too.  Protection. This is not an actual treatment. But, you could wear special pads to take care of any leakage while you wait for other treatments to take effect. This will help you avoid embarrassment.  Physical treatments.  Electrical stimulation. Electrodes will send gentle pulses to the nerves or muscles that help control the bladder. The goal is to strengthen them. Sometimes this is  done with the electrodes outside of the body. Or, they might be placed inside the body (implanted). This treatment can take several months to have an effect.  Medications. These are usually used along with other treatments. Several medicines are available. Some are injected into the muscles involved in urination. Others come in pill form. Medications sometimes prescribed include:  Anticholinergics. These drugs block the signals that the nerves deliver to the bladder. This keeps it from releasing urine at the wrong time. Researchers think the drugs might help in other ways, too.  Imipramine. This is an antidepressant. But, it relaxes bladder muscles.  Botox. This is still experimental. Some people believe that injecting it into the bladder muscles will relax them so they work more normally. It has also been injected into the sphincter muscle when the sphincter muscle does not open properly. This is a temporary fix, however. Also, it might make  matters worse, especially in older people.  Surgery.  A device might be implanted to help manage your nerves. It works on the nerves that signal when you need to urinate.  Surgery is sometimes needed with electrical stimulation. If the electrodes are implanted, this is done through surgery.  Sometimes repairs need to be made through surgery. For example, the size of the bladder can be changed. This is usually done in severe cases only. HOME CARE INSTRUCTIONS   Take any medications your healthcare provider prescribed or suggested. Follow the directions carefully.  Practice any lifestyle changes that are recommended. These might include:  Drinking less fluid or drinking at different times of the day. If you need to urinate often during the night, for example, you may need to stop drinking fluids early in the evening.  Cutting down on caffeine or alcohol. They can both make an overactive bladder worse. Caffeine is found in coffee, tea and sodas.  Doing Kegel exercises to strengthen muscles.  Losing weight, if that is recommended.  Eating a healthy and balanced diet. This will help you avoid constipation.  Keep a journal or a log. You might be asked to record how much you drink and when, and also when you feel the need to urinate.  Learn how to care for implants or other devices, such as pessaries. SEEK MEDICAL CARE IF:   Your overactive bladder gets worse.  You feel increased pain or irritation when you urinate.  You notice blood in your urine.  You have questions about any medications or devices that your healthcare provider recommended.  You notice blood, pus or swelling at the site of any test or treatment procedure.  You have an oral temperature above 102 F (38.9 C). SEEK IMMEDIATE MEDICAL CARE IF:  You have an oral temperature above 102 F (38.9 C), not controlled by medicine. Document Released: 12/20/2008 Document Revised: 05/18/2011 Document Reviewed:  12/20/2008 [facility] Patient Information 2015 [place], [place]. This information is not intended to replace advice given to you by your health care provider. Make sure you discuss any questions you have with your health care provider.

## 2013-09-28 ENCOUNTER — Encounter: Payer: Self-pay | Admitting: Family Medicine

## 2013-09-28 LAB — POCT URINALYSIS DIPSTICK
Bilirubin, UA: NEGATIVE
Glucose, UA: NEGATIVE
Ketones, UA: NEGATIVE
Nitrite, UA: NEGATIVE
Protein, UA: NEGATIVE
Spec Grav, UA: <=1.005
Urobilinogen, UA: NEGATIVE
pH, UA: 6

## 2013-09-28 LAB — URINE CULTURE: Colony Count: >=100000

## 2013-09-28 MED ORDER — NITROFURANTOIN MONOHYD MACRO 100 MG PO CAPS: 100.0000 mg | ORAL_CAPSULE | Freq: Two times a day (BID) | ORAL | Status: AC

## 2013-09-28 NOTE — Progress Notes (Signed)
Subjective:    Patient ID: Betty Cooper, female    DOB: Jan 15, 1966, 48 y.o.   MRN: [id number]  Urinary Frequency  Associated symptoms include frequency.    [name] is here today to discuss the following issues:  1)  Urinary Frequency:  She reports having urinary frequency for the past couple of months.  She says that she feels that she completely empties her bladder but then has the sensation in 5 minutes that she has to go again.  2)   Stress/Grief:  Her mother is currently in at the hospice house and she is expected to pass away any moment.   She is very heartbroken.     Review of Systems  Constitutional: Negative for fatigue.  Genitourinary: Positive for frequency.  Psychiatric/Behavioral: Positive for dysphoric mood (She is very sad about her mom.  ).     Past Medical History  Diagnosis Date  . Left inguinal hernia 06/25/2011  . Rosacea   . IBS (irritable bowel syndrome)   . HSV-1 infection   . HSV-2 infection   . 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 High risk HPV infection   . Impaired fasting glucose   . Morbid obesity   . Hyperlipidemia   . Varicose vein      Past Surgical History  Procedure Laterality Date  . Foot surgery      right foot nerve damage  . Tonsillectomy and adenoidectomy  1987  . Nasal sinus surgery    . Inguinal hernia repair  07/10/2011    Procedure: HERNIA REPAIR INGUINAL ADULT;  Surgeon: Wilmon ArmsMatthew K. Corliss Skainssuei, MD;  Location: [facility];  Service: General;  Laterality: Left;  Left Inguinal Hernia Repair with Mesh  . Foot surgery    . Knee surgery      Right Knee  . Laparoscopic unilateral salpingo oopherectomy      Right Ovary   . Abdominal hysterectomy  1993    "Partial" Cervix is still present.       History   Social History Narrative   Marital Status: Single    Children:  None    Pets: Dogs (Mia, ParamedicWolfie)   Living Situation: Lives with dogs.   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 Stroke Maternal Grandmother   . Heart disease Maternal Grandmother   . CVA Maternal Grandmother   . Diabetes Maternal Grandmother   . Alzheimer's disease Maternal Grandmother   . Stroke Maternal Grandfather   . Heart disease Maternal Grandfather   . CVA Maternal Grandfather   . Diabetes Maternal Grandfather   . Hyperlipidemia Mother   . Diabetes Brother   . Hyperlipidemia Brother   . Cancer Paternal Grandmother     Colon Cancer     Current Outpatient Prescriptions on File Prior to Visit  Medication Sig Dispense Refill  . buPROPion (WELLBUTRIN XL) 300 MG 24 hr tablet Take 1 tablet (300 mg total) by mouth every morning.  90 tablet  1  . clonazePAM (KLONOPIN) 1 MG tablet Take 1 tab up to BID as needed for increased anxiety  30 tablet  0  .  famciclovir (FAMVIR) 500 MG tablet Take 500 mg by mouth 3 (three) times daily.      Marland Kitchen venlafaxine XR (EFFEXOR XR) 150 MG 24 hr capsule Take 1 capsule (150 mg total) by mouth daily.  90 capsule  0  . XERESE 5-1 % CREA        No current facility-administered medications on file prior to visit.     Allergies  Allergen Reactions  . Adhesive [Tape]     Surgical GLUE     Immunization History  Administered Date(s) Administered  . Influenza-Unspecified 09/06/2012       Objective:   Physical Exam  Constitutional: She appears well-nourished.  Cardiovascular: Normal rate and regular rhythm.   Pulmonary/Chest: Effort normal and breath sounds normal.  Abdominal: There is no CVA tenderness.  Psychiatric: Her speech is normal and behavior is normal. Judgment and thought content normal. Her affect is not inappropriate. Cognition and memory are normal. She exhibits a depressed mood.  She is sad and tearful.        Assessment & Plan:    Tanda  was seen today for urinary frequency and sadness. We sent off a urine culture which grew >100,000 colonies of E. Coli so a prescription for Macrobid was sent in.  She will get started on Lexapro and take the Klonopin as needed.    Diagnoses and associated orders for this visit:  Dysuria - Urine culture - phenazopyridine (PYRIDIUM) 200 MG tablet; Take 1 tablet (200 mg total) by mouth 3 (three) times daily with meals. Take for 7 days  Depression - escitalopram (LEXAPRO) 10 MG tablet; Take 1/2 - 1 tab po daily.  Overactive bladder - tolterodine (DETROL LA) 4 MG 24 hr capsule; Take 1 capsule (4 mg total) by mouth daily. - mirabegron ER (MYRBETRIQ) 50 MG TB24 tablet; Take 1 tablet (50 mg total) by mouth daily.  Cystitis - nitrofurantoin, macrocrystal-monohydrate, (MACROBID) 100 MG capsule; Take 1 capsule (100 mg total) by mouth 2 (two) times daily.  TIME SPENT "FACE TO FACE" WITH PATIENT -  30 MINS

## 2013-10-25 ENCOUNTER — Encounter (HOSPITAL_BASED_OUTPATIENT_CLINIC_OR_DEPARTMENT_OTHER): Payer: Self-pay

## 2014-06-12 ENCOUNTER — Other Ambulatory Visit (HOSPITAL_BASED_OUTPATIENT_CLINIC_OR_DEPARTMENT_OTHER): Payer: Self-pay | Admitting: INTERNAL MEDICINE

## 2014-06-12 DIAGNOSIS — Z1231 Encounter for screening mammogram for malignant neoplasm of breast: Secondary | ICD-10-CM

## 2014-06-19 ENCOUNTER — Ambulatory Visit (HOSPITAL_BASED_OUTPATIENT_CLINIC_OR_DEPARTMENT_OTHER)
Admission: RE | Admit: 2014-06-19 | Discharge: 2014-06-19 | Disposition: A | Discharge status: Home / Self Care | Payer: Non-veteran care | Source: Ambulatory Visit | Attending: INTERNAL MEDICINE | Admitting: INTERNAL MEDICINE

## 2014-06-19 ENCOUNTER — Encounter (HOSPITAL_BASED_OUTPATIENT_CLINIC_OR_DEPARTMENT_OTHER): Payer: Self-pay

## 2014-06-19 DIAGNOSIS — Z1231 Encounter for screening mammogram for malignant neoplasm of breast: Secondary | ICD-10-CM

## 2014-10-29 ENCOUNTER — Other Ambulatory Visit (HOSPITAL_BASED_OUTPATIENT_CLINIC_OR_DEPARTMENT_OTHER): Payer: Self-pay | Admitting: Family Medicine

## 2014-10-29 ENCOUNTER — Ambulatory Visit (HOSPITAL_BASED_OUTPATIENT_CLINIC_OR_DEPARTMENT_OTHER)
Admission: RE | Admit: 2014-10-29 | Discharge: 2014-10-29 | Disposition: A | Discharge status: Home / Self Care | Source: Ambulatory Visit | Attending: Family Medicine | Admitting: Family Medicine

## 2014-10-29 DIAGNOSIS — R1032 Left lower quadrant pain: Secondary | ICD-10-CM

## 2014-10-29 MED ORDER — IOPAMIDOL 370 MG IODINE/ML (76 %) INTRAVENOUS SOLUTION
100.00 mL | INTRAVENOUS | Status: AC
Start: 2014-10-29 — End: 2014-10-29
  Administered 2014-10-29: 100 mL via INTRAVENOUS
  Filled 2014-10-29: qty 100

## 2015-01-08 ENCOUNTER — Ambulatory Visit (INDEPENDENT_AMBULATORY_CARE_PROVIDER_SITE_OTHER): Admitting: Urology

## 2015-01-08 ENCOUNTER — Encounter (INDEPENDENT_AMBULATORY_CARE_PROVIDER_SITE_OTHER): Payer: Self-pay | Admitting: Urology

## 2015-01-08 VITALS — BP 128/81 | HR 67 | Temp 98.0°F | Ht 68.0 in | Wt 186.0 lb

## 2015-01-08 DIAGNOSIS — N1330 Unspecified hydronephrosis: Principal | ICD-10-CM

## 2015-01-08 DIAGNOSIS — N393 Stress incontinence (female) (male): Secondary | ICD-10-CM

## 2015-01-08 DIAGNOSIS — R32 Unspecified urinary incontinence: Secondary | ICD-10-CM

## 2015-01-08 NOTE — Progress Notes (Signed)
01/08/15 1500   Urine   Glucose Negative   Bilirubin Negative   Ketones Negative   Urine Specific Gravity 1.015   Blood (urine) Negative   pH 8.0   Protein Negative   Urobilinogen Normal    Nitrite Negative   Leukocytes Negative   Initials JM

## 2015-01-09 ENCOUNTER — Telehealth (INDEPENDENT_AMBULATORY_CARE_PROVIDER_SITE_OTHER): Payer: Self-pay | Admitting: Urology

## 2015-01-09 ENCOUNTER — Other Ambulatory Visit (HOSPITAL_BASED_OUTPATIENT_CLINIC_OR_DEPARTMENT_OTHER): Payer: Self-pay | Admitting: Urology

## 2015-01-09 DIAGNOSIS — R32 Unspecified urinary incontinence: Secondary | ICD-10-CM

## 2015-01-09 NOTE — Progress Notes (Signed)
[facility]                                                                              PROGRESS NOTE    PATIENT NAME: Carmen Bell, Carmen Bell [facility] ZOXWRU:[id number]  DATE OF SERVICE:01/08/2015  DATE OF BIRTH: 1966/03/01    This is a 49 year old female who works as a Engineer, civil (consulting) at the [employer] who presents for evaluation of abnormal CT scan      HISTORY OF PRESENT ILLNESS:  The patient has a past urologic history which is significant for stress urinary incontinence.  She has tried multiple medications for this including oxybutynin, as well as high dose of pseudoephedrine.  She reports for these treatments were not very successful and she was concerned about the off-label use of the pseudoephedrine.  She continues to be bothered by her stress incontinence and is interested in pursuing other treatment options.  She indicates that at the end of August 2016 she developed severe left-sided abdominal pain.  There was a suspicion that she may be suffering from diverticulitis and she was evaluated with a CT scan abdomen and pelvis which I reviewed with her.  This showed unexpected finding of decreased perfusion going to the left kidney with associated left-sided hydronephrosis, no stones or any obvious source of infection could be seen on the CAT scan.  Her abdominal pain has resolved.  When she had these symptoms, she did not have a positive urine culture, flank pain, fevers or other signs of pyelonephritis.  The radiologist's interpretation were that the abnormal findings could be secondary to pyelonephritis.      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    ALLERGIES:  FLAGYL, DAILY and CASEIN.    MEDICATIONS:  Cetirizine, Motrin, Osteo Bi-Flex, multivitamins, melatonin, Benadryl and turmeric.      SOCIAL HISTORY:  Negative for tobacco or drug use.    FAMILY HISTORY:  Negative for prostate, bladder, kidney or  testicular cancer, is positive for liver and lung cancer in a paternal grandmother.    REVIEW OF SYSTEMS:  Positive for constipation, stress incontinence, flank pain, joint pain, back pain, and environmental allergies, otherwise 12-point review of systems is negative.      PHYSICAL EXAMINATION:  The patient is alert, awake, oriented x3 in no acute distress.  Respirations are nonlabored.  CARDIOVASCULAR:  Regular rate and rhythm.  ABDOMEN:  Soft and nondistended.    BACK:  Shows no CVA tenderness.  EXTREMITIES:  No edema.  NEUROLOGIC:  Grossly intact.    IMAGING STUDIES:  CT scan was reviewed with the patient and showed abnormal fat stranding around the kidney and ureter and hydronephrosis of unclear etiology.    ASSESSMENT AND PLAN:  1.  Patient with stress incontinence refractory to medications.  Plan for trial of pelvic floor physical therapy.  She was referred to an [facility] physical therapist for these purposes.  2.  CT scan showing hydronephrosis of unclear etiology.  Notable that there was no hydronephrosis on a renal ultrasound done at the [place] in June 2016.  It is possible that she may have passed a small stone prior to the CAT  scan that was not visualized on CT scan.  As her abdominal pain has resolved, we plan to do repeat imaging with CT urogram and, if hydronephrosis persists, she will likely require further evaluation with ureteroscopy.  We will also plan to send her urine out for cytology evaluation.      Trudie BucklerJohn Donzella Carrol, MD      [id number]; D: 01/08/2015 17:21:43 T: 01/09/2015 11:52:02

## 2015-01-09 NOTE — Telephone Encounter (Signed)
Patient notified of urine cytology order.  Patient will stop by the office Thursday morning to give urine specimen.  Orders placed by Dr Juliane Pootiordan.Lupe Carneyabatha Welling, LPN  16/1/096011/04/2014, 16:00

## 2015-01-09 NOTE — Telephone Encounter (Signed)
I tried contacting patient to make her aware that Dr Juliane Pootiordan would like her to have urine cytology.  Orders are placed.  Patient needs to go to lab and give urine sample.  [place] in [place], therefore [facility]  Dr Juliane Pootiordan placed order after urine had been disposed of yesterday.  I left message on patients voicemail requesting call back. Lupe Carneyabatha Welling, LPN  19/1/478211/04/2014, 09:50

## 2015-01-13 MED ORDER — IOPAMIDOL 370 MG IODINE/ML (76 %) INTRAVENOUS SOLUTION
100.00 mL | INTRAVENOUS | Status: AC
Start: 2015-01-13 — End: 2015-01-14
  Administered 2015-01-14: 75 mL via INTRAVENOUS
  Filled 2015-01-13: qty 100

## 2015-01-14 ENCOUNTER — Other Ambulatory Visit (INDEPENDENT_AMBULATORY_CARE_PROVIDER_SITE_OTHER): Payer: Self-pay | Admitting: Urology

## 2015-01-14 ENCOUNTER — Ambulatory Visit (HOSPITAL_BASED_OUTPATIENT_CLINIC_OR_DEPARTMENT_OTHER)

## 2015-01-14 ENCOUNTER — Ambulatory Visit (HOSPITAL_BASED_OUTPATIENT_CLINIC_OR_DEPARTMENT_OTHER)
Admission: RE | Admit: 2015-01-14 | Discharge: 2015-01-14 | Disposition: A | Discharge status: Home / Self Care | Source: Ambulatory Visit | Attending: Urology | Admitting: Urology

## 2015-01-14 ENCOUNTER — Other Ambulatory Visit (HOSPITAL_COMMUNITY): Payer: Self-pay | Admitting: Urology

## 2015-01-14 DIAGNOSIS — N1330 Unspecified hydronephrosis: Secondary | ICD-10-CM

## 2015-01-14 LAB — HISTORICAL URINARY CYTOLOGY

## 2015-01-15 ENCOUNTER — Ambulatory Visit (HOSPITAL_BASED_OUTPATIENT_CLINIC_OR_DEPARTMENT_OTHER)
Admission: RE | Admit: 2015-01-15 | Discharge: 2015-01-15 | Disposition: A | Discharge status: Still In Hospital | Source: Ambulatory Visit

## 2015-01-15 DIAGNOSIS — R32 Unspecified urinary incontinence: Secondary | ICD-10-CM

## 2015-01-15 NOTE — PT Evaluation (Signed)
[facility]  [place], [place] [address]     Outpatient Rehabilitation Services  Physical Therapy Initial Evaluation    Patient Name: Carmen Bell  Date of Birth: [birth year]  MRN: [id number]  Payor: BLUE CROSS BLUE SHIELD / Plan: FEDERAL BLUE CROSS / Product Type: PPO /   Diagnosis:  Urinary incontinence, unspecified  Recertification Date: 02/14/2015  Referring Physician:  Dr Juliane Poot  Next Physician Follow-up Visit: December    PMH: hx of Hemorrhoidectomy, Neuroma, hx of pyelonephritis      Subjective/Objective:   Carmen Bell is a 49 y.o., female, presenting with chief complaint of urinary leakage with coughing, sneezing and sometimes walking if she has a full bladder.  She reports she has had the problem since the birth of her first child 23 years ago.  She wears pad for protection, changing it one time per day.  She is voiding urine 3-4 times per day and sometimes once at night.  She denies frequency, urgency, pain, pressure with voiding.  She is still sexually active.  She thinks she may be going into menopause.  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  Her days vary as far as her activity, sometimes sits, sometimes stands majority of the day.  She has hx of constipation.      Observation: normal weight and stature.    Lumbar AROM limited by 25% in all planes.  Slight pain with end range flexion.     Negative SLR, negative neuro screen.    3+/5 abdominal strength. Unable to isolate TrA.    External pelvic exam:  Present voluntary contraction.Overflow contraction of gluteals and rectus abdominus.    Present voluntary relaxation.  Possible rectocele.    Internal pelvic exam.:  Large vesitbule.  No tenderness, pain along pelvic clock.  PFM strength: R 3/5. L 3/5. Good contraction with good relaxation.  PFM endurance: 4 sec at 3/5   Absent involuntary contraction before cough.  Quick flicks for coordination: 6 quick flicks in 10 seconds.  Able to isolate mm without  overflow contraction of gluteals.    14% Pelvic Floor Impact Questionnaire                       Assessment:   Fall Risk Assessment  Pt has no fall/safety risk identified    Pt is a 49 y/o female presenting with underactive pelvic floor mm.  She would benefit from skilled PT to address.      Goals:   Short-term Goals: In 2 weeks: to be achieved by 01/29/2015  Patient will verbalize an understanding of pelvic anatomy and causes of incontinence  Patient will verbalize rationale and purposes for exercises  Patient will be independent in the performance of a home program of PFM exercises  on a daily basis  Patient will demonstrate decreased overflow muscle activity during PFM contraction    Long Term Goals: to be achieved by 02/27/2015  Patient will demonstrate an increase in PFM contraction to 4/5 as measured  by MMT  Patient will demonstrate an increase in PFM endurance from 4-second hold to 8-  second hold x 10 repetitions as measured by EMG  Patient will demonstrate use of functional PFM contraction by performing a precontraction  (knack) to eliminate UI during a cough.      Discharge symptom index improved to 8%      Plan:     PT 1-2x/week x4-6 weeks for therapeutic exercise, neuromuscular reeducation, manual  therapy.    The risks/benefits of therapy have been discussed with the patient and he/she is in agreement with the established plan of care.     Therapist :       Nelida MeuseMelissa R Fredrico Beedle, PT 01/15/2015 11:41

## 2015-01-22 ENCOUNTER — Ambulatory Visit (HOSPITAL_BASED_OUTPATIENT_CLINIC_OR_DEPARTMENT_OTHER)
Admission: RE | Admit: 2015-01-22 | Discharge: 2015-01-22 | Disposition: A | Discharge status: Still In Hospital | Source: Ambulatory Visit

## 2015-01-22 NOTE — PT Treatment (Signed)
[facility]  [place], [place] [id number]     Outpatient Rehabilitation Services  Physical Therapy Progress Note    Patient Name: Carmen Bell  Date of Birth: June 15, 1965  MRN: [id number]  Payor: BLUE CROSS BLUE SHIELD / Plan: FEDERAL BLUE CROSS / Product Type: PPO /   Diagnosis: Urinary incontinence, unspecified  Recertification Date: 02/14/2015  Referring Physician: Dr Juliane Pootiordan  Next Physician Follow-up Visit: December    01/22/2015 is the patient's 2nd visit     Subjective   Pt reports she has been doing her exercises but so far has not seen any improvement in leaking sx.    Objective     Neuromuscular Reeducation: 25 min   With synergy 3D software  Contract/relax, 10 sec holds, quick flicks  All with constant cueing for proper breathing technique, avoiding overflow contractions     Total Treatment time:  25 min                        Assessment:   Pt had difficulty isolating pelvic floor mm without holding her breath.      Goals:   Short-term Goals: In 2 weeks: to be achieved by 01/29/2015  Patient will verbalize an understanding of pelvic anatomy and causes of incontinence  Patient will verbalize rationale and purposes for exercises  Patient will be independent in the performance of a home program of PFM exercises  on a daily basis  Patient will demonstrate decreased overflow muscle activity during PFM contraction    Long Term Goals: to be achieved by 02/27/2015  Patient will demonstrate an increase in PFM contraction to 4/5 as measured  by MMT  Patient will demonstrate an increase in PFM endurance from 4-second hold to 8-  second hold x 10 repetitions as measured by EMG  Patient will demonstrate use of functional PFM contraction by performing a precontraction  (knack) to eliminate UI during a cough.      Discharge symptom index improved to 8%      Plan:     PT 1-2x/week x4-6 weeks for therapeutic exercise, neuromuscular reeducation, manual therapy.          The  risks/benefits of therapy have been discussed with the patient and he/she is in agreement with the established plan of care.    Therapist :     Nelida MeuseMelissa R Kayron Kalmar, PT 01/22/2015 09:03

## 2015-01-29 ENCOUNTER — Ambulatory Visit (HOSPITAL_BASED_OUTPATIENT_CLINIC_OR_DEPARTMENT_OTHER)
Admission: RE | Admit: 2015-01-29 | Discharge: 2015-01-29 | Disposition: A | Discharge status: Still In Hospital | Source: Ambulatory Visit

## 2015-01-29 NOTE — PT Treatment (Signed)
[facility]  [place], [place] [id number]     Outpatient Rehabilitation Services  Physical Therapy Progress Note    Patient Name: Carmen CabalMary Irine Bell  Date of Birth: 1965-05-07  MRN: [id number]  Payor: BLUE CROSS BLUE SHIELD / Plan: FEDERAL BLUE CROSS / Product Type: PPO /   Diagnosis: Urinary incontinence, unspecified  Recertification Date: 02/14/2015  Referring Physician: Dr Juliane Pootiordan  Next Physician Follow-up Visit: December    01/29/2015 is the patient's 3rd visit     Subjective   Pt reports she has been doing her exercises but so far has not seen any improvement in leaking sx.    Objective     Neuromuscular Reeducation: 20 min   With synergy 3D software  Contract/relax, 10 sec holds, quick flicks  All with constant cueing for proper breathing technique, avoiding overflow contractions    Therapeutic Exercise: 10 min  PFM contraction with ball adduction, band abduction, bridges x20 each     Total Treatment time:  30 min                        Assessment:   Better isolation of PFM without holding breath today.  Difficulty maintaining with core exercises.    Goals:   Short-term Goals: In 2 weeks: to be achieved by 01/29/2015  Patient will verbalize an understanding of pelvic anatomy and causes of incontinence  Patient will verbalize rationale and purposes for exercises  Patient will be independent in the performance of a home program of PFM exercises  on a daily basis  Patient will demonstrate decreased overflow muscle activity during PFM contraction    Long Term Goals: to be achieved by 02/27/2015  Patient will demonstrate an increase in PFM contraction to 4/5 as measured  by MMT  Patient will demonstrate an increase in PFM endurance from 4-second hold to 8-  second hold x 10 repetitions as measured by EMG  Patient will demonstrate use of functional PFM contraction by performing a precontraction  (knack) to eliminate UI during a cough.      Discharge symptom index improved to  8%      Plan:     PT 1-2x/week x4-6 weeks for therapeutic exercise, neuromuscular reeducation, manual therapy.          The risks/benefits of therapy have been discussed with the patient and he/she is in agreement with the established plan of care.    Therapist :     Nelida MeuseMelissa R Bernard Donahoo, PT 01/29/2015 09:49

## 2015-02-05 ENCOUNTER — Ambulatory Visit (HOSPITAL_BASED_OUTPATIENT_CLINIC_OR_DEPARTMENT_OTHER)
Admission: RE | Admit: 2015-02-05 | Discharge: 2015-02-05 | Disposition: A | Discharge status: Still In Hospital | Source: Ambulatory Visit

## 2015-02-05 NOTE — PT Treatment (Signed)
[facility]  [place], [place] [id number]     Outpatient Rehabilitation Services  Physical Therapy Progress Note    Patient Name: Carmen Bell  Date of Birth: Jun 14, 1965  MRN: [id number]  Payor: BLUE CROSS BLUE SHIELD / Plan: FEDERAL BLUE CROSS / Product Type: PPO /   Diagnosis: Urinary incontinence, unspecified  Recertification Date: 02/14/2015  Referring Physician: Dr Juliane Pootiordan  Next Physician Follow-up Visit: December    02/05/2015 is the patient's 4th visit     Subjective   Pt reports she still sees no improvement in leaking.    Objective     Neuromuscular Reeducation: 25 min   With synergy 3D software  Contract/relax, 10 sec holds, quick flicks  All with constant cueing for proper breathing technique, avoiding overflow contractions    Therapeutic Exercise: (HEP)  PFM contraction with ball adduction, band abduction, bridges x20 each     Total Treatment time:  30 min                        Assessment:   Manually tested PFM strength today due to lack of progress.  She remains weak and has slight delay in activation of pelvic floor mm and could be contributing.  Otherwise she is being compliant with home exercises.  She returns to MD next week to discuss.    Goals:   Short-term Goals: In 2 weeks: to be achieved by 01/29/2015  Patient will verbalize an understanding of pelvic anatomy and causes of incontinence  Patient will verbalize rationale and purposes for exercises  Patient will be independent in the performance of a home program of PFM exercises  on a daily basis  Patient will demonstrate decreased overflow muscle activity during PFM contraction    Long Term Goals: to be achieved by 02/27/2015  Patient will demonstrate an increase in PFM contraction to 4/5 as measured  by MMT  Patient will demonstrate an increase in PFM endurance from 4-second hold to 8-  second hold x 10 repetitions as measured by EMG  Patient will demonstrate use of functional PFM contraction by performing a  precontraction  (knack) to eliminate UI during a cough.      Discharge symptom index improved to 8%      Plan:     PT 1-2x/week x4-6 weeks for therapeutic exercise, neuromuscular reeducation, manual therapy.          The risks/benefits of therapy have been discussed with the patient and he/she is in agreement with the established plan of care.    Therapist :     Nelida MeuseMelissa R Dreana Britz, PT 02/05/2015 11:23

## 2015-02-12 ENCOUNTER — Encounter (INDEPENDENT_AMBULATORY_CARE_PROVIDER_SITE_OTHER): Payer: Self-pay | Admitting: Urology

## 2015-02-12 ENCOUNTER — Ambulatory Visit (HOSPITAL_BASED_OUTPATIENT_CLINIC_OR_DEPARTMENT_OTHER)
Admission: RE | Admit: 2015-02-12 | Discharge: 2015-02-12 | Disposition: A | Discharge status: Still In Hospital | Source: Ambulatory Visit | Attending: Urology | Admitting: Urology

## 2015-02-12 ENCOUNTER — Ambulatory Visit (INDEPENDENT_AMBULATORY_CARE_PROVIDER_SITE_OTHER): Admitting: Urology

## 2015-02-12 VITALS — BP 152/84 | HR 76 | Temp 98.6°F | Ht 68.0 in | Wt 187.0 lb

## 2015-02-12 DIAGNOSIS — N393 Stress incontinence (female) (male): Principal | ICD-10-CM

## 2015-02-12 DIAGNOSIS — R319 Hematuria, unspecified: Secondary | ICD-10-CM

## 2015-02-12 DIAGNOSIS — R32 Unspecified urinary incontinence: Secondary | ICD-10-CM

## 2015-02-12 MED ORDER — SULFAMETHOXAZOLE 800 MG-TRIMETHOPRIM 160 MG TABLET
1.00 | ORAL_TABLET | Freq: Two times a day (BID) | ORAL | Status: DC
Start: 2015-02-12 — End: 2017-05-03

## 2015-02-12 NOTE — Progress Notes (Signed)
02/12/15 1600   Medication Administration   Initials JM   Medication Name Bactrim DS   Medication Dose 800mg /160mg    Route of Administration PO   NDC # [id number]   LOT # [id number]   Expiration date 02/07/16   Manufacturer amneal   Clinic Supplied Yes   Patient Supplied No   Comments: pt tolerated well

## 2015-02-12 NOTE — Procedures (Signed)
See dictated note.

## 2015-02-12 NOTE — Progress Notes (Signed)
02/12/15 1600   Urine   Glucose Negative   Bilirubin Negative   Ketones Negative   Urine Specific Gravity 1.010   Blood (urine) Trace Intact   pH 6.0   Protein Negative   Urobilinogen Normal    Nitrite Negative   Leukocytes Negative   Initials JM

## 2015-02-12 NOTE — PT Treatment (Signed)
[facility]  [place], [place] [id number]     Outpatient Rehabilitation Services  Physical Therapy Progress Note    Patient Name: Carmen CabalMary Irine Bell  Date of Birth: 06-18-1965  MRN: [id number]  Payor: BLUE CROSS BLUE SHIELD / Plan: FEDERAL BLUE CROSS / Product Type: PPO /   Diagnosis: Urinary incontinence, unspecified  Recertification Date: 02/14/2015  Referring Physician: Dr Juliane Pootiordan  Next Physician Follow-up Visit: December    02/12/2015 is the patient's 5th visit     Subjective   Pt reports she still sees no improvement in leaking.  She has been compliant with home exercise program.    Objective     Neuromuscular Reeducation: 25 min   With synergy 3D software  Contract/relax, 10 sec holds, quick flicks  All with constant cueing for proper breathing technique, avoiding overflow contractions    Therapeutic Exercise: (HEP)  PFM contraction with ball adduction, band abduction, bridges x20 each     Total Treatment time:  30 min                        Assessment:   Manually tested PFM strength last visit.  She remains weak and has slight delay in activation of pelvic floor mm and could be contributing.  Otherwise she is being compliant with home exercises and should be seeing imrpovement.  She returns to MD today to discuss lack of progress.    Goals:   Short-term Goals: In 2 weeks: to be achieved by 01/29/2015  Patient will verbalize an understanding of pelvic anatomy and causes of incontinence  Patient will verbalize rationale and purposes for exercises  Patient will be independent in the performance of a home program of PFM exercises  on a daily basis  Patient will demonstrate decreased overflow muscle activity during PFM contraction    Long Term Goals: to be achieved by 02/27/2015  Patient will demonstrate an increase in PFM contraction to 4/5 as measured  by MMT  Patient will demonstrate an increase in PFM endurance from 4-second hold to 8-  second hold x 10 repetitions as measured by  EMG  Patient will demonstrate use of functional PFM contraction by performing a precontraction  (knack) to eliminate UI during a cough.      Discharge symptom index improved to 8%      Plan:     PT 1-2x/week x4-6 weeks for therapeutic exercise, neuromuscular reeducation, manual therapy.          The risks/benefits of therapy have been discussed with the patient and he/she is in agreement with the established plan of care.    Therapist :     Nelida MeuseMelissa R Dontray Haberland, PT 02/12/2015 09:28

## 2015-02-12 NOTE — Progress Notes (Signed)
02/12/15 1600   Urine   Glucose Negative   Bilirubin Negative   Ketones Negative   Urine Specific Gravity 1.010   Blood (urine) Trace Intact   pH 6.0   Protein Negative   Urobilinogen Normal    Nitrite Negative   Leukocytes Negative   PVR Volume 124mL   Initials JM

## 2015-02-13 NOTE — Progress Notes (Signed)
[facility]                                                                              PROGRESS NOTE    PATIENT NAME: Carmen PennerSMITH, [facility]en [facility]  [facility] ZOXWRU:[id number]:[id number]  DATE OF SERVICE:02/12/2015  DATE OF BIRTH: 11/21/1965    REASON FOR VISIT:  Carmen Bell is a 49 year old female presenting for followup of bothersome stress urinary incontinence.    HISTORY OF PRESENT ILLNESS:  The patient reports that for the last 20 years, she has been very bothered by stress incontinence.  She reports that with physical activity, coughing, sneezing, laughing, she is leaking a significant amount of urine.  She has tried high-dose Sudafed without significant improvement.  She also has recently gone through pelvic floor physical therapy with Erik ObeyMelissa Caton, and unfortunately she has not noticed a significant improvement in her leakage of urine.  She also is being evaluated for abnormal CT scan.  She had very severe inflammatory changes on a CT scan of the abdomen and pelvis from August 2016.  We sent her for a repeat scan in November 2016, and showed that these findings had resolved in the interim.  She was reassured about the benign nature of this, but she continues to have microscopic hematuria on evaluation.    CYSTOSCOPY:  To evaluate the patient's hematuria and urinary incontinence, cystoscopy was performed.  Informed consent was obtained.  She was taken to the procedure room and placed in the frogleg position, prepped and draped in the standard sterile manner.  A 16-French flexible scope was inserted per urethra up into the bladder.  Pancystourethroscopy was performed demonstrating normal-appearing bladder mucosa.  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  Both ureteral orifices were identified in orthotopic position and were seen to efflux clear urine.  No evidence of bladder tumors, stones, or diverticula was seen.  The scope was withdrawn.  On  bearing down, the patient had evidence of significant urethral hypermobility with leakage of a significant amount of urine.  She also had evidence of a grade I-II cystocele as well as rectocele.    ASSESSMENT AND PLAN:  1.  Patient with bothersome stress urinary incontinence, which is refractory to medical therapy as well as pelvic floor physical therapy.  We discussed option of single incision vaginal sling.  The risks of this procedure, including risks associated with using vaginal mesh, urinary retention, bleeding, infection, nerve pain, injury to adjacent organs were all discussed with her.  She wants to think all these things over and let us know if she wants to proceed with a sling placement.  We also discussed the possibility of doing rectocele or cystocele repair at the same time but if she is not bothered by pelvic organ prolapse, my inclination would be not to address these issues unless they become more bothersome.  I spent over 50% of 25-minute visit counseling and coordinating her care.      Trudie BucklerJohn Aser Nylund, MD      [id number]; D: 02/12/2015 16:51:48 T: 02/13/2015 [id number]

## 2015-04-14 ENCOUNTER — Other Ambulatory Visit: Payer: Self-pay

## 2015-07-08 ENCOUNTER — Other Ambulatory Visit (INDEPENDENT_AMBULATORY_CARE_PROVIDER_SITE_OTHER): Payer: Self-pay | Admitting: Family Medicine

## 2015-07-08 ENCOUNTER — Other Ambulatory Visit (HOSPITAL_BASED_OUTPATIENT_CLINIC_OR_DEPARTMENT_OTHER): Payer: Self-pay | Admitting: Family Medicine

## 2015-07-08 DIAGNOSIS — Z1231 Encounter for screening mammogram for malignant neoplasm of breast: Secondary | ICD-10-CM

## 2015-07-18 ENCOUNTER — Encounter (HOSPITAL_BASED_OUTPATIENT_CLINIC_OR_DEPARTMENT_OTHER): Payer: Self-pay

## 2015-07-18 ENCOUNTER — Ambulatory Visit (HOSPITAL_BASED_OUTPATIENT_CLINIC_OR_DEPARTMENT_OTHER)
Admission: RE | Admit: 2015-07-18 | Discharge: 2015-07-18 | Disposition: A | Discharge status: Home / Self Care | Source: Ambulatory Visit | Attending: Family Medicine | Admitting: Family Medicine

## 2015-07-18 DIAGNOSIS — Z1231 Encounter for screening mammogram for malignant neoplasm of breast: Secondary | ICD-10-CM | POA: Insufficient documentation

## 2015-10-11 ENCOUNTER — Other Ambulatory Visit (HOSPITAL_BASED_OUTPATIENT_CLINIC_OR_DEPARTMENT_OTHER): Payer: Self-pay

## 2015-10-11 DIAGNOSIS — Z96641 Presence of right artificial hip joint: Secondary | ICD-10-CM

## 2015-10-28 ENCOUNTER — Ambulatory Visit (HOSPITAL_BASED_OUTPATIENT_CLINIC_OR_DEPARTMENT_OTHER)
Admission: RE | Admit: 2015-10-28 | Discharge: 2015-10-28 | Disposition: A | Discharge status: Still In Hospital | Source: Ambulatory Visit

## 2015-10-28 DIAGNOSIS — Z96641 Presence of right artificial hip joint: Secondary | ICD-10-CM | POA: Insufficient documentation

## 2015-10-29 NOTE — PT Evaluation (Signed)
[facility]  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  She started having pain in her hip last April.  By May, it was affecting her ADL.  She had an injection in November but it did not provide relief.  In December, [place] and did a lot of walking.  She reports the hip pain then became severe.  It has been getting progressively worse this year.  She underwent R total hip arthroplasty on 10/22/2015.  She reports her pain is already improved since surgery, pain rated 4/10 on average.  Aggs: sitting, picking leg up, getting up after sitting prolonged periods of time.  She is walking with B axillary crutches at all times.  She has stairs in her home, she is able to go up and down one at a time.  Her sleep is disrupted.  She is starting to take care of herself more but relies on husband and daughter.  She is taking Oxycodone as needed.  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  Her job requires a lot of walking.  She is also a member of the [employer] and will have to eventually perform PT test which requires push ups, sit ups and fast walking.      Objective:  Gait: step through gait with B axillary crutches.  Leans towards L side.      TUG test: 33 seconds with B axillary crutches.  Sit to stand requires use of UE.    Bed mobility: I but  uses UE to lift R LE.    2+/5 R hip flexion, abduction.  Did not test R hip extension, ER, IR.  All other B LE strength WNL.    46% Hip Oswestry.    Assessment:   Fall Risk Assessment  Pt requires the use of an assistive device (walker, cane, crutches, etc) and is currently taking one of these medications:  Diuretics, Anti-convulsants, Sedatives or Narcotics    Pt is a 50 y/o female presenting s/p R total hip arthroplasty.  She has post operative weakness, limited ROM, pain and gait difficulty with reliance on assistive device.  She would benefit from skilled PT to return her to her mobility baseline.her prognosis is good.        Goals:   Physical Therapy Short Term Goals   In 2 weeks: to be achieved by 11/13/2015:  Pt will be compliant with therapy sessions and HEP.  Pt will report max pain of 3/10.  Pt will have 50% recall and demo of HEP for hip ROM, strengthening.  Pt will be able to ambulate I/safely household and community distances with [facility].    Physical Therapy Long Term Goals   In 4-6 weeks: to be achieved by 12/13/2015:  Pt will be I c HEP.  Pt will score <  20% on Hip Oswestry indicating functional improvement since initial evaluation.  Pt will have 4/5 hip strength or greater to allow for easier completion of ADL>   Pt will be able to ambulate household and community distances s AD I/safely.       Plan:     PT 2x/week x4-6 weeks for therapeutic exercise, manual therapy, electrical stimulation, gait training.       [id number]   History No personal factors or co-morbidities 1-2 personal factors or co-morbidities 3 or more personal factors or co-morbidities   Examination of Body Systems Addressing 1-2 elements Addressing a total of 3 or more elements Addressing a total of 4 or more elements   Clinical Presentation Stable Evolving Unstable   Typical Face-to-Face Time (minutes) 20 30 45   Clinical Decision Making (Complexity) Low Moderate High         The risks/benefits of therapy have been discussed with  the patient and he/she is in agreement with the established plan of care.     Therapist :       Nelida MeuseMelissa R Roxana Lai, PT 10/29/2015 14:59

## 2015-10-30 ENCOUNTER — Ambulatory Visit (HOSPITAL_BASED_OUTPATIENT_CLINIC_OR_DEPARTMENT_OTHER)
Admission: RE | Admit: 2015-10-30 | Discharge: 2015-10-30 | Disposition: A | Discharge status: Still In Hospital | Source: Ambulatory Visit

## 2015-10-30 NOTE — PT Treatment (Signed)
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    Objective     Therapeutic Exercise: 40 min  Nustep 8 min  HCS, HSS 2x30 sec  Knee flexion stretch on 8 inch box 10x10 sec  Heel raises, toe raises x20  Step ups 4 inch step x10  LAQ x20  Supine:   Bridges  Heel slide with hip flexion with sheet assist on sliding board x20  Hip abduction on sliding board x20       Total Treatment time:  40 min                        Assessment:   Good tolerance to ex's.  Still heavily reliant on UE for ambulation and ex's.    Goals:   Physical Therapy Short Term Goals   In 2 weeks: to be achieved by 11/13/2015:  Pt will be compliant with therapy sessions and HEP.  Pt will report max pain of 3/10.  Pt will have 50% recall and demo of HEP for hip ROM, strengthening.  Pt will be able to ambulate I/safely household and community distances [facility]    Physical Therapy Long Term Goals   In 4-6 weeks: to be achieved by 12/13/2015:  Pt will be I c HEP.  Pt will score <20% on Hip Oswestry indicating functional improvement since initial evaluation.  Pt will have 4/5 hip strength or greater to allow for easier completion of ADL>                 Pt will be able to ambulate household and community distances s AD I/safely.                      Plan:     PT 2x/week x4-6 weeks for therapeutic exercise, manual therapy, electrical stimulation, gait training.      The risks/benefits of therapy have been discussed with the  patient and he/she is in agreement with the established plan of care.    Therapist :     Nelida MeuseMelissa R Yzabelle Calles, PT  10/30/2015

## 2015-11-01 ENCOUNTER — Ambulatory Visit (HOSPITAL_BASED_OUTPATIENT_CLINIC_OR_DEPARTMENT_OTHER)
Admission: RE | Admit: 2015-11-01 | Discharge: 2015-11-01 | Disposition: A | Discharge status: Still In Hospital | Source: Ambulatory Visit

## 2015-11-01 NOTE — PT Treatment (Signed)
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    Objective   She is 10 days from surgery.  She is on 2 axillary crutches.    Therapeutic Exercise: 40 min  Nustep 8 min  HCS, HSS 2x30 sec  Knee flexion stretch on 8 inch box 10x10 sec  Heel raises, toe raises x20  Step ups 4 inch step x10  LAQ x20  Supine:   Bridges  Heel slide with hip flexion with sheet assist on sliding board x20  Hip abduction on sliding board x20       Total Treatment time:  40 min                        Assessment:   Good tolerance to ex's.  Still heavily reliant on UE for ambulation and ex's.    Goals:   Physical Therapy Short Term Goals   In 2 weeks: to be achieved by 11/13/2015:  Pt will be compliant with therapy sessions and HEP.  Pt will report max pain of 3/10.  Pt will have 50% recall and demo of HEP for hip ROM, strengthening.  Pt will be able to ambulate I/safely household and [facility]    Physical Therapy Long Term Goals   In 4-6 weeks: to be achieved by 12/13/2015:  Pt will be I c HEP.  Pt will score <20% on Hip Oswestry indicating functional improvement since initial evaluation.  Pt will have 4/5 hip strength or greater to allow for easier completion of ADL>                 Pt will be able to ambulate household and community distances s AD I/safely.                      Plan:     PT 2x/week x4-6 weeks for therapeutic exercise, manual therapy, electrical stimulation, gait  training.      The risks/benefits of therapy have been discussed with the patient and he/she is in agreement with the established plan of care.    Therapist :     Algis Greenhouseim Glema Takaki, PT  10/30/2015

## 2015-11-04 ENCOUNTER — Ambulatory Visit (HOSPITAL_BASED_OUTPATIENT_CLINIC_OR_DEPARTMENT_OTHER)
Admission: RE | Admit: 2015-11-04 | Discharge: 2015-11-04 | Disposition: A | Discharge status: Still In Hospital | Source: Ambulatory Visit

## 2015-11-04 NOTE — PT Treatment (Signed)
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  She has been walking in the home with one crutch.  Pain is improving.  She is taking less pain medication.      Objective   She is 10 days from surgery.  She is on 2 axillary crutches.    Therapeutic Exercise: 40 min  Nustep 8 min  HCS, HSS 2x30 sec  Knee flexion stretch on 8 inch box 10x10 sec  Heel raises, toe raises x20  Step ups 4 inch step x10  LAQ x20  Supine:   Bridges  Heel slide with hip flexion with sheet assist on sliding board x20  Hip abduction on sliding board x20       Total Treatment time:  40 min                        Assessment:   Less reliant on UE for ambulation and exercises today.  She has better control/strength lifting R LE with table ex's.      Goals:   Physical Therapy Short Term Goals   In 2 weeks: to be achieved by 11/13/2015:  Pt will be compliant with therapy sessions and HEP.  Pt will report max pain of 3/10.  Pt will have 50% recall and demo of HEP for hip ROM, strengthening.  Pt will be able to ambulate I/safely household and community distances with [facility].    Physical Therapy Long Term Goals   In 4-6 weeks: to be achieved by 12/13/2015:  Pt will be I c HEP.  Pt will score <20% on Hip Oswestry indicating functional improvement since initial evaluation.  Pt will have 4/5 hip strength or greater to allow for easier completion of ADL>                 Pt will be able to ambulate household and community distances s AD  I/safely.                      Plan:     PT 2x/week x4-6 weeks for therapeutic exercise, manual therapy, electrical stimulation, gait training.      The risks/benefits of therapy have been discussed with the patient and he/she is in agreement with the established plan of care.    Therapist :   Nelida MeuseMelissa R Krishana Lutze, PT   11/04/2015

## 2015-11-06 ENCOUNTER — Ambulatory Visit (HOSPITAL_BASED_OUTPATIENT_CLINIC_OR_DEPARTMENT_OTHER)
Admission: RE | Admit: 2015-11-06 | Discharge: 2015-11-06 | Disposition: A | Discharge status: Still In Hospital | Source: Ambulatory Visit

## 2015-11-06 ENCOUNTER — Ambulatory Visit (HOSPITAL_BASED_OUTPATIENT_CLINIC_OR_DEPARTMENT_OTHER)

## 2015-11-06 NOTE — PT Treatment (Signed)
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  She has been walking in the home with one crutch.  Pain is improving.  She is taking less pain medication.    She saw the MD today.  Good verbal report.  No changes to the program.    Objective   She is on 1 crutch    Therapeutic Exercise: 40 min    Nustep 8 min  HCS, HSS 2x30 sec  Knee flexion stretch on 8 inch box 10x10 sec  Heel raises, toe raises x20  Step ups 4 inch step x10  Added sideways walking.  X 5 laps.   Added standing abduction x 2 x 10.  LAQ x20  Supine:   [employer]  Held.  Heel slide with hip flexion with sheet assist on sliding board   Added  Supine hip fallouts.  Left sidelying clamshells x 2 x 10.  Hip abduction on sliding board x20    Total Treatment time:  40 min                        Assessment:   Less reliant on UE for ambulation and exercises today.  She has better control/strength lifting R LE with table ex's.      Goals:   Physical Therapy Short Term Goals   In 2 weeks: to be achieved by 11/13/2015:  Pt will be compliant with therapy sessions and HEP.  Pt will report max pain of 3/10.  Pt will have 50% recall and demo of HEP for hip ROM, strengthening.  Pt will be able to ambulate I/safely household and community distances with [facility].    Physical Therapy Long Term Goals   In 4-6 weeks: to be achieved by 12/13/2015:  Pt will be I c HEP.  Pt will score <20% on Hip Oswestry indicating functional improvement since initial  evaluation.  Pt will have 4/5 hip strength or greater to allow for easier completion of ADL>                 Pt will be able to ambulate household and community distances s AD I/safely.                      Plan:     PT 2x/week x4-6 weeks for therapeutic exercise, manual therapy, electrical stimulation, gait training.      The risks/benefits of therapy have been discussed with the patient and he/she is in agreement with the established plan of care.    Therapist :   Algis Greenhouseim Dixon Luczak, PT   11/06/2015

## 2015-11-08 ENCOUNTER — Ambulatory Visit (HOSPITAL_BASED_OUTPATIENT_CLINIC_OR_DEPARTMENT_OTHER)
Admission: RE | Admit: 2015-11-08 | Discharge: 2015-11-08 | Disposition: A | Discharge status: Still In Hospital | Source: Ambulatory Visit

## 2015-11-08 NOTE — PT Treatment (Signed)
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    Objective   She is on 1 crutch  Gait without crutch looks fair. Minimal glut weakness but does limp.  It seems like the asymetery is more rotational.  ? Tight anterior hip.    Therapeutic Exercise: 40 min    Nustep 8 min  HCS, HSS 2x30 sec  Knee flexion stretch on 8 inch box 10x10 sec  Heel raises, toe raises x20  Step ups 4 inch step x10  Added sideways walking.  X 5 laps.   Added standing abduction x 2 x 10.  LAQ x20  Supine:   [employer]ridges  Held.  Heel slide with hip flexion with sheet assist on sliding board   Added  Supine hip fallouts.  Left sidelying clamshells x 2 x 10.  Hip abduction on sliding board x20    Total Treatment time:  40 min                        Assessment:   Less reliant on UE for ambulation and exercises today.  She has better control/strength lifting R LE with table ex's.      Goals:   Physical Therapy Short Term Goals   In 2 weeks: to be achieved by 11/13/2015:  Pt will be compliant with therapy sessions and HEP.  Pt will report max pain of 3/10.  Pt will have 50% recall and demo of HEP for hip ROM, strengthening.  Pt will be able to ambulate I/safely household and community distances with [facility].    Physical Therapy Long Term Goals   In 4-6 weeks: to be achieved by 12/13/2015:  Pt will be I c HEP.  Pt will score <20% on Hip Oswestry indicating functional improvement since initial evaluation.  Pt will have 4/5 hip strength or greater to allow for easier  completion of ADL>                 Pt will be able to ambulate household and community distances s AD I/safely.                      Plan:     PT 2x/week x4-6 weeks for therapeutic exercise, manual therapy, electrical stimulation, gait training.      The risks/benefits of therapy have been discussed with the patient and he/she is in agreement with the established plan of care.    Therapist :   Algis Greenhouseim Alexx Giambra, PT   11/08/2015

## 2015-11-13 ENCOUNTER — Ambulatory Visit (HOSPITAL_BASED_OUTPATIENT_CLINIC_OR_DEPARTMENT_OTHER)
Admission: RE | Admit: 2015-11-13 | Discharge: 2015-11-13 | Disposition: A | Discharge status: Still In Hospital | Source: Ambulatory Visit

## 2015-11-13 NOTE — PT Treatment (Signed)
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      Objective   She is on 1 crutch  Gait without crutch looks fair. Minimal glut weakness but does limp.  It seems like the asymetery is more rotational.  ? Tight anterior hip.    Therapeutic Exercise: 40 min    Nustep 8 min  HCS, HSS 2x30 sec  Knee flexion stretch on 8 inch box 10x10 sec  Heel raises, toe raises x20  Step ups 8 inch step x10 (increased step height today)  sideways walking.  X 5 laps.   standing abduction x 2 x 10.  LAQ x20  Supine:   Bridges  Heel slide with hip flexion   Supine hip fallouts with green tband  Left sidelying clamshells x 2 x 10.  Hip abduction on sliding board x20  Added tall bike seat 12 5 min    Total Treatment time:  40 min                        Assessment:   She cont to be stiff through her R hip especially with ER.      Goals:   Physical Therapy Short Term Goals   In 2 weeks: to be achieved by 11/13/2015:  Pt will be compliant with therapy sessions and HEP.  Pt will report max pain of 3/10.  Pt will have 50% recall and demo of HEP for hip ROM, strengthening.  Pt will be able to ambulate I/safely household and community distances with [facility].    Physical Therapy Long Term Goals   In 4-6 weeks: to be achieved by 12/13/2015:  Pt will be I c HEP.  Pt will score <20% on Hip Oswestry indicating functional improvement since initial evaluation.  Pt will have 4/5 hip strength or greater to allow for easier completion of ADL>                   Pt will be able to ambulate household and community distances s AD I/safely.                      Plan:     PT 2x/week x4-6 weeks for therapeutic exercise, manual therapy, electrical stimulation, gait training.      The risks/benefits of therapy have been discussed with the patient and Carmen Bell is in agreement with the established plan of care.    Therapist :   Nelida MeuseMelissa R Coretta Leisey, PT   11/13/2015

## 2015-11-15 ENCOUNTER — Ambulatory Visit (HOSPITAL_BASED_OUTPATIENT_CLINIC_OR_DEPARTMENT_OTHER)
Admission: RE | Admit: 2015-11-15 | Discharge: 2015-11-15 | Disposition: A | Discharge status: Still In Hospital | Source: Ambulatory Visit

## 2015-11-15 NOTE — PT Treatment (Signed)
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  Feels like she is progressing at home.    Objective   She is on 1 crutch  Gait today seems similar.  However her hip extension seems better.  The difficulty seems to occur at the initiation of swing.  She tends to lead with pelvis hiking and hip flexion is difficult.    Therapeutic Exercise: 40 min    Nustep 8 min tall  bike seat 12 5 min    HCS, HSS 2x30 sec  Knee flexion stretch on 8 inch box 10x10 sec  Heel raises, toe raises x20  Step ups 8 inch step x10 (increased step height today)  sideways walking.  X 5 laps.   standing abduction x 2 x 10.  LAQ x20  Supine:   Bridges  Heel slide with hip flexion   Supine hip fallouts with green tband  Left sidelying clamshells x 2 x 10.  Hip abduction on sliding board x20      Total Treatment time:  40 min                        Assessment:   She cont to be stiff through her R hip especially with ER.      Goals:   Physical Therapy Short Term Goals   In 2 weeks: to be achieved by 11/13/2015:  Pt will be compliant with therapy sessions and HEP.  Pt will report max pain of 3/10.  Pt will have 50% recall and demo of HEP for hip ROM, strengthening.  Pt will be able to ambulate I/safely household and community distances with [facility].    Physical Therapy Long Term Goals   In 4-6 weeks: to be achieved by 12/13/2015:  Pt will be I c HEP.  Pt will score <20% on Hip Oswestry indicating functional improvement since initial evaluation.  Pt  will have 4/5 hip strength or greater to allow for easier completion of ADL>                 Pt will be able to ambulate household and community distances s AD I/safely.                      Plan:     PT 2x/week x4-6 weeks for therapeutic exercise, manual therapy, electrical stimulation, gait training.      The risks/benefits of therapy have been discussed with the patient and he/she is in agreement with the established plan of care.    Therapist :   Algis Greenhouseim Elliemae Braman, PT   11/15/2015

## 2015-11-18 ENCOUNTER — Ambulatory Visit (HOSPITAL_BASED_OUTPATIENT_CLINIC_OR_DEPARTMENT_OTHER)
Admission: RE | Admit: 2015-11-18 | Discharge: 2015-11-18 | Disposition: A | Discharge status: Still In Hospital | Source: Ambulatory Visit

## 2015-11-18 NOTE — PT Treatment (Signed)
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  She is going back to work this Thursday.    Objective   She is on 1 crutch  Gait today seems similar.  However her hip extension seems better.  The difficulty seems to occur at the initiation of swing.  She tends to lead with pelvis hiking and hip flexion is difficult.    Therapeutic Exercise: 40 min    Nustep 8 min tall  bike seat 12 5 min    HCS, HSS 2x30 sec  Knee flexion stretch on 8 inch box 10x10 sec  Heel raises, toe raises x20  Step ups 8 inch step x15  standing abduction x 2 x 10  Supine:   Bridges   Heel slide with hip flexion   Supine hip fallouts with green tband  Left sidelying clamshells x 2 x 10.  Hip abduction in sidelying today 2x10      Total Treatment time:  40 min                        Assessment:   Hip abduction ex was difficult for her than I anticipated it to be.  She even engaged her core without cueing.      Goals:   Physical Therapy Short Term Goals   In 2 weeks: to be achieved by 11/13/2015:  Pt will be compliant with therapy sessions and HEP.  Pt will report max pain of 3/10.  Pt will have 50% recall and demo of HEP for hip ROM, strengthening.  Pt will be able to ambulate I/safely household and community distances with [facility].    Physical Therapy Long Term Goals   In 4-6 weeks: to be achieved by 12/13/2015:  Pt will be I c HEP.  Pt will score <20% on Hip Oswestry indicating functional improvement since initial evaluation.  Pt  will have 4/5 hip strength or greater to allow for easier completion of ADL>                 Pt will be able to ambulate household and community distances s AD I/safely.                      Plan:     PT 2x/week x4-6 weeks for therapeutic exercise, manual therapy, electrical stimulation, gait training.      The risks/benefits of therapy have been discussed with the patient and he/she is in agreement with the established plan of care.    Therapist :   Nelida MeuseMelissa R Leanne Sisler, PT   11/18/2015

## 2015-11-20 ENCOUNTER — Ambulatory Visit (HOSPITAL_BASED_OUTPATIENT_CLINIC_OR_DEPARTMENT_OTHER)
Admission: RE | Admit: 2015-11-20 | Discharge: 2015-11-20 | Disposition: A | Discharge status: Still In Hospital | Source: Ambulatory Visit

## 2015-11-20 NOTE — PT Treatment (Signed)
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      Objective   [facility]    Therapeutic Exercise: 40 min    Nustep 8 min tall  bike seat 12 5 min    HCS, HSS 2x30 sec  Knee flexion stretch on 8 inch box 10x10 sec  Heel raises, toe raises x20  Step ups 8 inch step x15  Supine:   Bridges   Supine hip fallouts with green tband  Left sidelying clamshells x 2 x 10.  Hip abduction in sidelying 2x10      Total Treatment time:  40 min                        Assessment:   Gait cont to be slow, cautious but less antalgic overall with cane today.        Goals:   Physical Therapy Short Term Goals   In 2 weeks: to be achieved by 11/13/2015:  Pt will be compliant with therapy sessions and HEP.  Pt will report max pain of 3/10.  Pt will have 50% recall and demo of HEP for hip ROM, strengthening.  Pt will be able to ambulate I/safely household and community distances with [facility].    Physical Therapy Long Term Goals   In 4-6 weeks: to be achieved by 12/13/2015:  Pt will be I c HEP.  Pt will score <20% on Hip Oswestry indicating functional improvement since initial evaluation.  Pt will have 4/5 hip strength or greater to allow for easier completion of ADL>                 Pt will be able to ambulate household and community distances s AD I/safely.                      Plan:     PT 2x/week x4-6 weeks for therapeutic exercise, manual therapy, electrical stimulation, gait training.      The risks/benefits of therapy  have been discussed with the patient and he/she is in agreement with the established plan of care.    Therapist :   Nelida MeuseMelissa R Annissa Andreoni, PT   11/20/2015

## 2015-11-22 ENCOUNTER — Ambulatory Visit (HOSPITAL_BASED_OUTPATIENT_CLINIC_OR_DEPARTMENT_OTHER)
Admission: RE | Admit: 2015-11-22 | Discharge: 2015-11-22 | Disposition: A | Discharge status: Still In Hospital | Source: Ambulatory Visit

## 2015-11-22 NOTE — PT Treatment (Signed)
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  Fatigue.    Objective   [facility].    Therapeutic Exercise: 40 min    Nustep 8 min tall  bike seat 12 5 min    HCS, HSS 2x30 sec  Knee flexion stretch on 8 inch box 10x10 sec  Heel raises, toe raises x20  Step ups 8 inch step x15  Supine:   Bridges   Supine hip fallouts with green tband  Left sidelying clamshells x 2 x 10.  Hip abduction in sidelying 2x10    Total Treatment time:  40 min                        Assessment:   Good effort.  No c/o with the session.  Primarly limitation seems to be hip flexor weakness.    Goals:   Physical Therapy Short Term Goals   In 2 weeks: to be achieved by 11/13/2015:  Pt will be compliant with therapy sessions and HEP.  Pt will report max pain of 3/10.  Pt will have 50% recall and demo of HEP for hip ROM, strengthening.  Pt will be able to ambulate I/safely household and community distances with [facility].    Physical Therapy Long Term Goals   In 4-6 weeks: to be achieved by 12/13/2015:  Pt will be I c HEP.  Pt will score <20% on Hip Oswestry indicating functional improvement since initial evaluation.  Pt will have 4/5 hip strength or greater to allow for easier completion of ADL>                 Pt will be able to ambulate household and community distances s AD I/safely.                      Plan:     PT 2x/week x4-6 weeks for therapeutic exercise, manual therapy, electrical stimulation, gait  training.      The risks/benefits of therapy have been discussed with the patient and he/she is in agreement with the established plan of care.    Therapist :   Nelida MeuseMelissa R Caton, PT   11/22/2015

## 2015-11-26 ENCOUNTER — Ambulatory Visit (HOSPITAL_BASED_OUTPATIENT_CLINIC_OR_DEPARTMENT_OTHER)
Admission: RE | Admit: 2015-11-26 | Discharge: 2015-11-26 | Disposition: A | Discharge status: Still In Hospital | Source: Ambulatory Visit | Attending: Rehabilitative and Restorative Service Providers" | Admitting: Rehabilitative and Restorative Service Providers"

## 2015-11-26 NOTE — PT Treatment (Signed)
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     Objective   [facility].    Therapeutic Exercise: 40 min    Nustep 8 min tall  bike seat 12 5 min    HCS, HSS 2x30 sec  Knee flexion stretch on 8 inch box 10x10 sec  Heel raises, toe raises x20  Step ups 8 inch step x15  Supine:   Bridges   Supine hip fallouts with green tband  Left sidelying clamshells x 2 x 10.  Hip abduction in sidelying 2x10    Total Treatment time:  40 min                        Assessment:   Hip flexor weakness, but is motivated to getting stronger.     Goals:   Physical Therapy Short Term Goals   In 2 weeks: to be achieved by 11/13/2015:  Pt will be compliant with therapy sessions and HEP.  Pt will report max pain of 3/10.  Pt will have 50% recall and demo of HEP for hip ROM, strengthening.  Pt will be able to ambulate I/safely household and community distances with [facility].    Physical Therapy Long Term Goals   In 4-6 weeks: to be achieved by 12/13/2015:  Pt will be I c HEP.  Pt will score <20% on Hip Oswestry indicating functional improvement since initial evaluation.  Pt will have 4/5 hip strength or greater to allow for easier completion of ADL>                 Pt will be able to ambulate household and community distances s AD I/safely.                      Plan:     PT 2x/week x4-6 weeks for therapeutic exercise, manual therapy, electrical stimulation, gait training.      The risks/benefits of therapy have been  discussed with the patient and he/she is in agreement with the established plan of care.    Therapist :   Samule DryKesha Camelia Stelzner, PTA  11/26/2015 08:11

## 2015-11-28 ENCOUNTER — Ambulatory Visit (HOSPITAL_BASED_OUTPATIENT_CLINIC_OR_DEPARTMENT_OTHER)
Admission: RE | Admit: 2015-11-28 | Discharge: 2015-11-28 | Disposition: A | Discharge status: Still In Hospital | Source: Ambulatory Visit

## 2015-11-28 DIAGNOSIS — Z471 Aftercare following joint replacement surgery: Secondary | ICD-10-CM | POA: Insufficient documentation

## 2015-11-28 DIAGNOSIS — Z96641 Presence of right artificial hip joint: Secondary | ICD-10-CM | POA: Insufficient documentation

## 2015-11-28 NOTE — PT Treatment (Signed)
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  She reports she feels she is functioning much better than she was prior to surgery.  She still has difficulty going up and down stairs, walking prolonged distances without the cane and she has restless legs at night if she doesn't Tramadol.  She takes Tylenol prn.     Objective   No device.      Therapeutic Exercise: 40 min    Nustep 8 min tall  bike seat 12 5 min    HCS, HSS 2x30 sec  Knee flexion stretch on 8 inch box 10x10 sec  Heel raises, toe raises x20  Step ups 8 inch step x15,   step up and over x10  Wobble board fwd/bwd, side/side x1 min each  step ups to foam x30  Standing hip abduction    HEP:  Hip abduction in sidelying 2x10   Bridges  Clamshells    Total Treatment time:  40 min                        Assessment:   Cont to progress nicely with strengthening program.       Goals:   Physical Therapy Short Term Goals   In 2 weeks: to be achieved by 11/13/2015:  Pt will be compliant with therapy sessions and HEP.  Pt will report max pain of 3/10.  Pt will have 50% recall and demo of HEP for hip ROM, strengthening.  Pt will be able to ambulate I/safely household and community distances with [facility].    Physical Therapy Long Term Goals   In 4-6 weeks: to be achieved by 12/13/2015:  Pt will be I c HEP.  Pt will score <20% on Hip Oswestry indicating functional improvement since initial evaluation.  Pt will have 4/5 hip strength or greater to allow for  easier completion of ADL>                 Pt will be able to ambulate household and community distances s AD I/safely.                      Plan:     PT 2x/week x4-6 weeks for therapeutic exercise, manual therapy, electrical stimulation, gait training.      The risks/benefits of therapy have been discussed with the patient and he/she is in agreement with the established plan of care.    Therapist :   Nelida MeuseMelissa R Edina Winningham, PT   11/28/2015 17:17

## 2015-12-03 ENCOUNTER — Ambulatory Visit (HOSPITAL_BASED_OUTPATIENT_CLINIC_OR_DEPARTMENT_OTHER)
Admission: RE | Admit: 2015-12-03 | Discharge: 2015-12-03 | Disposition: A | Discharge status: Still In Hospital | Source: Ambulatory Visit

## 2015-12-03 NOTE — PT Treatment (Signed)
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  She reports she feels she is functioning much better than she was prior to surgery.  She takes her cane for prolonged distance walking.  She has restless legs at night if she doesn't Tramadol.  She takes Tylenol prn. She is able to go up and down stairs slowly but in reciprocal pattern.    Objective   4/5 hip flexion, abduction, extension.    Gait slow but not antalgic without device.    Hip flexion WNL but stiff at end range.  ER to 45 deg, slight pain.    Therapeutic Exercise: 40 min  Nustep 8 min tall  bike seat 12 5 min  HCS, HSS 2x30 sec  Knee flexion stretch on 8 inch box 10x10 sec  Step ups 8 inch step x15,   Lateral step ups to 6 inch step x20  Wobble board fwd/bwd, side/side x1 min each  step ups to foam x30  Lateral step up and over onto/off of foam  Standing hip abduction    HEP:  Hip abduction in sidelying 2x10   Bridges  Clamshells    Total Treatment time:  40 min                        Assessment:   Pt progressing nicely towards long term goals.  Her strength is 4/5 with all hip motions and she is ambulating without cane most of the time with minimal limp.      Goals:   Physical Therapy Short Term Goals   In 2 weeks: to be achieved by 11/13/2015:  Pt will be compliant with therapy sessions and HEP.  Pt will report max pain of 3/10.  Pt will have 50% recall and demo of HEP for hip ROM, strengthening.  Pt will be able to ambulate  I/safely household and community distances with [facility].    Physical Therapy Long Term Goals   In 4-6 weeks: to be achieved by 12/13/2015:  Pt will be I c HEP.  Pt will score <20% on Hip Oswestry indicating functional improvement since initial evaluation.  Pt will have 4/5 hip strength or greater to allow for easier completion of ADL.                  Pt will be able to ambulate household and community distances s AD I/safely.                      Plan:     PT 2x/week next week and then D/C to HEP.      The risks/benefits of therapy have been discussed with the patient and he/she is in agreement with the established plan of care.    Therapist :   Nelida MeuseMelissa R Ellery Tash, PT   12/03/2015 08:13

## 2015-12-06 ENCOUNTER — Ambulatory Visit (HOSPITAL_BASED_OUTPATIENT_CLINIC_OR_DEPARTMENT_OTHER)
Admission: RE | Admit: 2015-12-06 | Discharge: 2015-12-06 | Disposition: A | Discharge status: Still In Hospital | Source: Ambulatory Visit | Attending: Rehabilitative and Restorative Service Providers" | Admitting: Rehabilitative and Restorative Service Providers"

## 2015-12-06 NOTE — PT Treatment (Signed)
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  Still some awkwardness with up/ down stairs.  Objective   4/5 hip flexion, abduction, extension.    Gait slow but not antalgic without device.    Hip flexion WNL but stiff at end range.  ER to 45 deg, slight pain.    Therapeutic Exercise: 40 min  Nustep 8 min tall  bike seat 12 5 min  HCS, HSS 2x30 sec  Knee flexion stretch on 8 inch box 10x10 sec  Step ups 8 inch step x15,   Lateral step ups to 6 inch step x20  Wobble board fwd/bwd, side/side x1 min each  step ups to foam x30  Lateral step up and over onto/off of foam  Standing hip abduction    HEP:  Hip abduction in sidelying 2x10   Bridges  Clamshells    Total Treatment time:  40 min                        Assessment:   Pt progressing nicely towards long term goals.  Her strength is 4/5 with all hip motions and she is ambulating without cane most of the time with minimal limp.      Goals:   Physical Therapy Short Term Goals   In 2 weeks: to be achieved by 11/13/2015:  Pt will be compliant with therapy sessions and HEP.  Pt will report max pain of 3/10.  Pt will have 50% recall and demo of HEP for hip ROM, strengthening.  Pt will be able to ambulate I/safely household and community distances with [facility].    Physical Therapy Long Term Goals   In 4-6 weeks: to be achieved by 12/13/2015:  Pt will be I c HEP.  Pt will score <20% on Hip Oswestry indicating functional improvement since initial  evaluation.  Pt will have 4/5 hip strength or greater to allow for easier completion of ADL.                  Pt will be able to ambulate household and community distances s AD I/safely.                      Plan:   She was under the impression she was d/c by PT.  However I left note for Melissa to call if she wanted her to continue.      The risks/benefits of therapy have been discussed with the patient and he/she is in agreement with the established plan of care.    Therapist :     Algis Greenhouseim Kailon Treese, PT   12/06/2015 14:13

## 2016-02-23 ENCOUNTER — Other Ambulatory Visit: Payer: Self-pay

## 2016-08-28 ENCOUNTER — Other Ambulatory Visit (INDEPENDENT_AMBULATORY_CARE_PROVIDER_SITE_OTHER): Payer: Self-pay | Admitting: Family Medicine

## 2016-08-28 ENCOUNTER — Other Ambulatory Visit (HOSPITAL_BASED_OUTPATIENT_CLINIC_OR_DEPARTMENT_OTHER): Payer: Self-pay | Admitting: Family Medicine

## 2016-08-28 DIAGNOSIS — Z1231 Encounter for screening mammogram for malignant neoplasm of breast: Secondary | ICD-10-CM

## 2016-09-10 ENCOUNTER — Ambulatory Visit (HOSPITAL_BASED_OUTPATIENT_CLINIC_OR_DEPARTMENT_OTHER)
Admission: RE | Admit: 2016-09-10 | Discharge: 2016-09-10 | Disposition: A | Discharge status: Home / Self Care | Source: Ambulatory Visit | Attending: Family Medicine | Admitting: Family Medicine

## 2016-09-10 ENCOUNTER — Encounter (HOSPITAL_BASED_OUTPATIENT_CLINIC_OR_DEPARTMENT_OTHER): Payer: Self-pay

## 2016-09-10 DIAGNOSIS — Z1231 Encounter for screening mammogram for malignant neoplasm of breast: Secondary | ICD-10-CM | POA: Insufficient documentation

## 2016-09-23 ENCOUNTER — Ambulatory Visit: Attending: Family Medicine

## 2016-09-23 DIAGNOSIS — Z136 Encounter for screening for cardiovascular disorders: Secondary | ICD-10-CM | POA: Insufficient documentation

## 2016-09-23 DIAGNOSIS — N951 Menopausal and female climacteric states: Secondary | ICD-10-CM | POA: Insufficient documentation

## 2016-09-23 DIAGNOSIS — Z13228 Encounter for screening for other metabolic disorders: Secondary | ICD-10-CM | POA: Insufficient documentation

## 2016-09-23 LAB — COMPREHENSIVE METABOLIC PROFILE - BMC/JMC ONLY
ALBUMIN/GLOBULIN RATIO: 1.3 (ref 0.8–2.0)
ALBUMIN: 4 g/dL (ref 3.5–5.0)
ALKALINE PHOSPHATASE: 66 U/L (ref 38–126)
ALT (SGPT): 16 U/L (ref 14–54)
ANION GAP: 6 mmol/L (ref 3–11)
AST (SGOT): 20 U/L (ref 15–41)
AST (SGOT): 20 U/L (ref 15–41)
BILIRUBIN TOTAL: 1.1 mg/dL (ref 0.3–1.2)
BUN/CREA RATIO: 19 (ref 6–22)
BUN: 12 mg/dL (ref 6–20)
CALCIUM: 9.8 mg/dL (ref 8.6–10.3)
CHLORIDE: 106 mmol/L (ref 101–111)
CO2 TOTAL: 26 mmol/L (ref 22–32)
CREATININE: 0.64 mg/dL (ref 0.44–1.00)
ESTIMATED GFR: >60 mL/min/1.73mˆ2 (ref >60)
GLUCOSE: 93 mg/dL (ref 70–110)
POTASSIUM: 4.4 mmol/L (ref 3.4–5.1)
PROTEIN TOTAL: 7 g/dL (ref 6.4–8.3)
SODIUM: 138 mmol/L (ref 136–145)

## 2016-09-23 LAB — FSH: FSH: 50.1 IU/L

## 2016-09-23 LAB — LIPID PANEL
CHOL/HDL RATIO: 2.9
CHOLESTEROL: 224 mg/dL — ABNORMAL HIGH (ref 120–199)
HDL CHOL: 76 mg/dL — ABNORMAL HIGH (ref 45–65)
LDL CALC: 135 mg/dL — ABNORMAL HIGH (ref <=130)
TRIGLYCERIDES: 63 mg/dL (ref 35–135)
VLDL CALC: 13 mg/dL (ref 5–35)

## 2016-09-23 LAB — ESTRADIOL: ESTRADIOL: 52 pg/mL

## 2016-09-23 LAB — THYROID STIMULATING HORMONE WITH FREE T4 REFLEX: TSH: 2.679 u[IU]/mL (ref 0.340–5.330)

## 2017-04-09 ENCOUNTER — Telehealth (INDEPENDENT_AMBULATORY_CARE_PROVIDER_SITE_OTHER): Payer: Self-pay | Admitting: Family Medicine

## 2017-04-09 NOTE — Telephone Encounter (Signed)
Patient scheduled. [place] Kirbi [place], [place]  04/09/2017, 10:07

## 2017-04-09 NOTE — Telephone Encounter (Signed)
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 She [facility]. I see that you only have 30 min appointments towards the end of march and she states that she needs to be seen soon.  When would you like to see this patient? [place] Dean [place], [place]  04/09/2017, 09:39

## 2017-04-09 NOTE — Telephone Encounter (Signed)
Ok to take any 30 minute held slot asap.      Griffith CitronBrittany Gevon Markus, MD  04/09/2017, 09:48

## 2017-05-03 ENCOUNTER — Ambulatory Visit: Attending: Family Medicine

## 2017-05-03 ENCOUNTER — Other Ambulatory Visit (INDEPENDENT_AMBULATORY_CARE_PROVIDER_SITE_OTHER): Payer: Self-pay

## 2017-05-03 ENCOUNTER — Encounter (INDEPENDENT_AMBULATORY_CARE_PROVIDER_SITE_OTHER): Payer: Self-pay | Admitting: Family Medicine

## 2017-05-03 ENCOUNTER — Ambulatory Visit (INDEPENDENT_AMBULATORY_CARE_PROVIDER_SITE_OTHER): Admitting: Family Medicine

## 2017-05-03 VITALS — BP 122/84 | HR 68 | Temp 98.0°F | Resp 16 | Ht 68.0 in | Wt 225.4 lb

## 2017-05-03 DIAGNOSIS — G8929 Other chronic pain: Secondary | ICD-10-CM

## 2017-05-03 DIAGNOSIS — K219 Gastro-esophageal reflux disease without esophagitis: Secondary | ICD-10-CM

## 2017-05-03 DIAGNOSIS — Z6834 Body mass index (BMI) 34.0-34.9, adult: Secondary | ICD-10-CM

## 2017-05-03 DIAGNOSIS — R109 Unspecified abdominal pain: Secondary | ICD-10-CM

## 2017-05-03 DIAGNOSIS — M25512 Pain in left shoulder: Principal | ICD-10-CM

## 2017-05-03 DIAGNOSIS — Z1239 Encounter for other screening for malignant neoplasm of breast: Secondary | ICD-10-CM

## 2017-05-03 DIAGNOSIS — Z1211 Encounter for screening for malignant neoplasm of colon: Secondary | ICD-10-CM

## 2017-05-03 LAB — URINALYSIS, MACROSCOPIC
BILIRUBIN: NEGATIVE mg/dL
BLOOD: NEGATIVE mg/dL
GLUCOSE: NEGATIVE mg/dL
KETONES: NEGATIVE mg/dL
LEUKOCYTES: NEGATIVE WBCs/uL
NITRITE: NEGATIVE
PH: 5.5 (ref <8.0)
PROTEIN: NEGATIVE mg/dL
SPECIFIC GRAVITY: 1.011 (ref <1.022)
UROBILINOGEN: <2 mg/dL (ref <=2.0)

## 2017-05-03 LAB — COMPREHENSIVE METABOLIC PROFILE - BMC/JMC ONLY
ALBUMIN/GLOBULIN RATIO: 1.4 (ref 0.8–2.0)
ALBUMIN: 3.7 g/dL (ref 3.5–5.0)
ALKALINE PHOSPHATASE: 69 U/L (ref 38–126)
ALT (SGPT): 19 U/L (ref 14–54)
ANION GAP: 7 mmol/L (ref 3–11)
AST (SGOT): 22 U/L (ref 15–41)
BILIRUBIN TOTAL: 0.7 mg/dL (ref 0.3–1.2)
BUN/CREA RATIO: 10 (ref 6–22)
BUN: 8 mg/dL (ref 6–20)
CALCIUM: 9.2 mg/dL (ref 8.6–10.3)
CHLORIDE: 101 mmol/L (ref 101–111)
CO2 TOTAL: 27 mmol/L (ref 22–32)
CREATININE: 0.79 mg/dL (ref 0.44–1.00)
ESTIMATED GFR: >60 mL/min/{1.73_m2} (ref >60)
GLUCOSE: 97 mg/dL (ref 70–110)
POTASSIUM: 4 mmol/L (ref 3.4–5.1)
PROTEIN TOTAL: 6.3 g/dL — ABNORMAL LOW (ref 6.4–8.3)
SODIUM: 135 mmol/L — ABNORMAL LOW (ref 136–145)

## 2017-05-03 NOTE — Progress Notes (Signed)
Hi Irene,    Your blood sugar, kidney, and liver function were normal.  Urinalysis looked good - no blood.    Griffith CitronBrittany Kimberlynn Lumbra, MD  05/03/2017, 16:1021:09

## 2017-05-03 NOTE — Progress Notes (Signed)
[facility] [place] [facility] [facility]  [address]  [address]  [place] [id number]  [phone]    Follow up    Chief Complaint:   Chief Complaint   Patient presents with   . 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 female, who presents today for 6 month follow up.   Patient returned from deployment.  She states That she injured her left shoulder while on a ladder several months ago.  SHe states she slipped on 1 of the steps of the ladder, and grabbed a hold of the side of a ladder to catch herself.  SHe had shooting pain in her left shoulder with radiation down her arm.  The pain has continued intermittently.  SHe reports the pain is better with rest.  She tried taking some Celebrex for the pain, but noticed she began having very dark urine and flank pain.  She stop taking Celebrex, and the symptoms resolved.  She has been doing home exercises with minimal improvement in her shoulder.      Past History  Current Outpatient Medications   Medication Sig   . CALCIUM CARBONATE/VITAMIN D3 (VITAMIN D-3 ORAL) Take 1 Tab by mouth Once a day   . cetirizine (ZYRTEC) 10 mg Oral Tablet Take 10 mg by mouth Once a day   . DIPHENHYDRAMINE HCL (BENADRYL ALLERGY ORAL) Take by mouth   . GLUC/CHON-MSM#1/C/MANG/BOS/BOR (OSTEO BI-FLEX TRIPLE STRENGTH ORAL) Take 1 Tab by mouth   . Ibuprofen (MOTRIN) 800 mg Oral Tablet Take 800 mg by mouth Three times a day as needed for Pain   . MGOX/VIT C/PANTH/B6/KAV/SGIN (B6-C-MGOX-PANTH-KAVA-S.GINSENG ORAL) Take 1 Tab by mouth Once a day   . MULTIVIT WITH CALCIUM,IRON,MIN (WOMEN'S DAILY MULTIVITAMIN ORAL) Take 1 Tab by mouth Once a day   . omeprazole (PRILOSEC) 20 mg Oral Capsule, Delayed Release(E.C.) Take 20 mg by mouth Once a day     Allergies   Allergen Reactions   . Flagyl [Metronidazole] Rash     Also yeast infection   . Casein   Other Adverse Reaction (Add comment)     Sinus issues, diarrhea   . Percocet [Oxycodone]  Other Adverse Reaction (Add comment)     Hypotension, Diaphoretic Breathing, Dizziness, Nausea, and Vomiting     History reviewed. No pertinent past medical history.    Past Surgical History:   Procedure Laterality Date   . HX HIP REPLACEMENT Right            Family History  Family Medical History:     Problem Relation (Age of Onset)    No Known Problems Mother, Father, Sister, Brother, Maternal Grandmother, Maternal Grandfather, Paternal Grandmother, Paternal Grandfather, Daughter, Son, Maternal Aunt, Maternal Uncle, Paternal Aunt, Paternal Uncle, Other              Social History  Social History     Socioeconomic History   . Marital status: Single     Spouse name: Not on file   . Number of children: Not on file   . Years of education: Not on file   . Highest education level: Not on file   Social Needs   . Financial resource strain: Not on file   . Food insecurity - worry: Not on file   .  Food insecurity - inability: Not on file   . Transportation needs - medical: Not on file   . Transportation needs - non-medical: Not on file   Occupational History   . Not on file   Tobacco Use   . Smoking status: Never Smoker   . Smokeless tobacco: Never Used   Substance and Sexual Activity   . Alcohol use: Yes     Alcohol/week: 0.0 oz     Comment: rare   . Drug use: No   . Sexual activity: Not on file   Other Topics Concern   . Not on file   Social History Narrative   . Not on file       Review of Systems  Constitutional: no fevers, chills  HEENT: no headache, sore throat, ear pain, or nasal congestion  Heart: no chest pain or palpitations  Pulm: No cough or shortness of breath  GI: no n/v/d    Examination  Vitals: BP 122/84   Pulse 68   Temp 36.7 C (98 F) (Oral)   Resp 16   Ht 1.727 m (5\' 8" )   Wt 102.2 kg (225 lb 6.4 oz)   BMI 34.27 kg/m       General: appears in good health and no distress.  Eyes: Conjunctiva clear., Pupils  equal and round.   HENT: head NCAT, MMM.  Neck: supple.  Cardiovascular: RRR, 1-2/6 systolic murmur  Lungs: clear to auscultation bilaterally.   Abdomen: soft, non-tender and bowel sounds normal.  Extremities: no cyanosis or edema.  Skin: No rashes or lesions and warm and dry.  Neurologic: Cranial nerves: 2-12 grossly intact.  Psychiatric: normal affect, behavior, memory, thought content, judgement, and speech.  MS: left shoulder - full ROM, strength 5/5, pain with external rotation, abduction      Diagnosis and Plan    1. Chronic left shoulder pain    2. Flank pain    3. Screening for breast cancer    4. Screening for colon cancer    5. Gastroesophageal reflux disease without esophagitis        Left shoulder pain: now present for several months.  No improvement w/ consvative therapy (nsaids, home exercises).  MRI ordered     Flank pain: this has resolved since stopping celebrex.  Will check cmp and u/a.     GERD: continue omeprazole 20 mg daily.     7/18 labs reviewed   Mammo ordered    Pap nl summer 2017 w/ neg HPV   Referral placed for colonoscopy   Received flu shot this year     Follow up in 1 year for physical      Orders Placed This Encounter   . MAMMO SCREENING BILATERAL W/ TOMO   . MRI SHOULDER LEFT WO IV CONTRAST   . COMPREHENSIVE METABOLIC PROFILE - [facility] ONLY   . URINALYSIS WITH REFLEX TO CULTURE IF INDICATED [facility] ONLY   . Refer to [facility]       Griffith Citron, MD

## 2017-05-03 NOTE — Nursing Note (Signed)
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 Flagyl [Metronidazole] Rash     Also yeast infection   . Casein  Other Adverse Reaction (Add comment)     Sinus issues, diarrhea   . Percocet [Oxycodone]  Other Adverse Reaction (Add comment)     Hypotension, Diaphoretic Breathing, Dizziness, Nausea, and Vomiting     Medication History  Reviewed for OTC medication and any new medications, provider will review medication history  Care Team  Patient Care Team:  Griffith CitronGusic, Brittany, MD as PCP - General ([facility])  Immunizations - last 24 hours     None        Gaspar GarbeSoila Corvin Sorbo, [place]  05/03/2017, 14:54

## 2017-05-10 ENCOUNTER — Other Ambulatory Visit (INDEPENDENT_AMBULATORY_CARE_PROVIDER_SITE_OTHER): Payer: Self-pay | Admitting: Family Medicine

## 2017-05-11 MED ORDER — DIAZEPAM 5 MG TABLET
ORAL_TABLET | ORAL | 0 refills | Status: DC
Start: 2017-05-11 — End: 2017-11-04

## 2017-05-11 NOTE — Nursing Note (Signed)
[facility] [phone]  Anise SalvoNoah Abbagale Goguen, MA  05/11/2017, 10:41

## 2017-05-12 ENCOUNTER — Ambulatory Visit (HOSPITAL_BASED_OUTPATIENT_CLINIC_OR_DEPARTMENT_OTHER)
Admission: RE | Admit: 2017-05-12 | Discharge: 2017-05-12 | Disposition: A | Discharge status: Home / Self Care | Source: Ambulatory Visit | Attending: Family Medicine | Admitting: Family Medicine

## 2017-05-12 DIAGNOSIS — G8929 Other chronic pain: Secondary | ICD-10-CM | POA: Insufficient documentation

## 2017-05-12 DIAGNOSIS — M24112 Other articular cartilage disorders, left shoulder: Secondary | ICD-10-CM | POA: Insufficient documentation

## 2017-05-12 DIAGNOSIS — M65812 Other synovitis and tenosynovitis, left shoulder: Secondary | ICD-10-CM | POA: Insufficient documentation

## 2017-05-12 DIAGNOSIS — M25512 Pain in left shoulder: Secondary | ICD-10-CM

## 2017-05-14 ENCOUNTER — Other Ambulatory Visit (INDEPENDENT_AMBULATORY_CARE_PROVIDER_SITE_OTHER): Payer: Self-pay | Admitting: Family Medicine

## 2017-05-14 DIAGNOSIS — M25512 Pain in left shoulder: Secondary | ICD-10-CM

## 2017-05-14 DIAGNOSIS — G8929 Other chronic pain: Principal | ICD-10-CM

## 2017-05-14 NOTE — Progress Notes (Signed)
Hi Irine,    Your MRI showed mild tendinosis, but no tear of your rotator cuff.  [facility].    Griffith CitronBrittany Fatim Vanderschaaf, MD  05/14/2017, 16:36

## 2017-05-27 ENCOUNTER — Ambulatory Visit (HOSPITAL_BASED_OUTPATIENT_CLINIC_OR_DEPARTMENT_OTHER)
Admission: RE | Admit: 2017-05-27 | Discharge: 2017-05-27 | Disposition: A | Discharge status: Still In Hospital | Source: Ambulatory Visit | Attending: Family Medicine | Admitting: Family Medicine

## 2017-05-27 DIAGNOSIS — M25512 Pain in left shoulder: Principal | ICD-10-CM

## 2017-05-27 DIAGNOSIS — G8929 Other chronic pain: Secondary | ICD-10-CM | POA: Insufficient documentation

## 2017-05-27 NOTE — PT Evaluation (Signed)
[facility] (A Department of [facility])  [address]  [place], [place] [address]  Phone [phone]  Fax [phone]       Outpatient Rehabilitation Services  Physical Therapy Initial Evaluation    Patient Name: Carmen Bell  Date of Birth: 1965/08/15  MRN: [id number]  Payor: BLUE CROSS BLUE SHIELD / Plan: FEDERAL BLUE CROSS / Product Type: PPO /   Diagnosis:  Chronic L shoulder pain  Recertification Date: 06/27/2017  Referring Physician:  Dr Rosalyn Gess  Next Physician Follow-up Visit: Unknown    Past Surgical History:   Procedure Laterality Date   . HX HIP REPLACEMENT Right        Subjective/Objective:   Carmen Bell is a 52 y.o., female, presenting with chief complaint of L shoulder pain.  She was deployed to [place] last year and while deployed she slipped off of a ladder.  She held tight to the ladder when she slipped to protect her R hip replacement and immediately felt pain on anterolateral aspect of shoulder and upper arm.  Pain subsided after a few minutes. She also started having L sided back pain at that time.  She saw the physician while deployed and they decided to wait on therapy as she was coming home in a few weeks.  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  Shoulder pain has improved dramatically since then.  She still has intermittent pain with reaching out to the side, behind the back, overhead, lifting (avoiding for now) and sleeping on L side.  Eases: Tylenol, rest.  She denies N/T/weakness in L UE but she did have some immediately after injury.  She had MRI of the shoulder earlier this month.  She is R handed.      Objective Findings:   Observation: L shoulder anterior and superior compared to R.    ROM:    L Shoulder AROM/PROM:  Flexion WNL, painful arc noted/full, painful, firm end feel.  Abduction WNL, painful arc noted/full, painful, firm end feel  IR functional  behind back to L2, PROM at 45 degrees of abduction: 30 deg, painful, firm end feel  ER functional behind head equal to R, PROM at 0 deg abduction 30 deg, painful, firm end feel    R Shoulder AROM/PROM WNL and painfree    Strength:  Shoulder ER 4+/5 *slight pain  Shoulder IR 5/5 *slight pain  Deltoid Strength 5/5 *slight pain  Bicep Strength 5/5  Tricep Strength 5/5    Palpation for Provocation:  TTP RTC tendons minimally.    Special Testing:  Positive Empty Can Test, positive Hawkin's Kennedy Test    Pt scored 48% on Shoulder Disability Index.                             Assessment:   Fall Risk Assessment  Pt has no fall/safety risk identified    Pt is a 52 y/o female presenting with s/s consistent with shoulder impingement syndrome following injury to her shoulder about 6 months ago.  Sx are improving with rest.  Her rotator cuff was strong but painful with resisted motion testing.  She would benefit from skilled PT for postural strengthening, RTC/scapular mm strengthening and modalities for pain relief/to promote healing.  Prognosis is good.      Goals:   PT STG: In 2 weeks;  to be achieved by 06/10/2017:  Pt will be compliant with therapy sessions and HEP.  Pt will report max pain of 4/10.  Pt will have 50% recall and demo of HEP for shoulder ROM, strengthening.    PT LTG: In 4-6 weeks: to be achieved by 07/10/2017  Pt will be I c HEP.  Pt will score <20% on Shoulder Disability index indicating functional improvement since initial evaluation.  Pt will have full painfree shoulder ROM to allow for easier completion of ADL like washing hair.  Pt will report no pain with sleeping.  Pt will be able to lift as needed without pain.  Pt will have 5/5 painless shoulder strength to allow for easier completion of ADL.      Plan:     PT 2x/week x4-6 weeks for therapeutic exercise, manual therapy, US, electrical stimulation.       [id number] [phone]   History No personal factors or co-morbidities 1-2 personal factors or  co-morbidities 3 or more personal factors or co-morbidities   Examination of Body Systems Addressing 1-2 elements Addressing a total of 3 or more elements Addressing a total of 4 or more elements   Clinical Presentation Stable Evolving Unstable   Typical Face-to-Face Time (minutes) 20 30 45   Clinical Decision Making (Complexity) Low Moderate High         The risks/benefits of therapy have been discussed with the patient and he/she is in agreement with the established plan of care.     Therapist :       Nelida MeuseMelissa R Caton, PT 05/27/2017 08:48

## 2017-06-01 ENCOUNTER — Ambulatory Visit (HOSPITAL_BASED_OUTPATIENT_CLINIC_OR_DEPARTMENT_OTHER)
Admission: RE | Admit: 2017-06-01 | Discharge: 2017-06-01 | Disposition: A | Discharge status: Still In Hospital | Source: Ambulatory Visit | Attending: Family Medicine | Admitting: Family Medicine

## 2017-06-01 NOTE — PT Treatment (Addendum)
[facility]  [place], [place] [id number]     Outpatient Rehabilitation Services  Physical Therapy Progress Note    Patient Name: Carmen CabalMary Irine Bell  Date of Birth: 03-30-1965  MRN: [id number]  Payor: BLUE CROSS BLUE SHIELD / Plan: FEDERAL BLUE CROSS / Product Type: PPO /   Diagnosis:  Chronic L shoulder pain  Recertification Date: 06/27/2017  Referring Physician:  Dr Rosalyn GessGusic  Next Physician Follow-up Visit: Unknown        06/01/2017  is the patient's 2nd visit    Subjective     Patient reported that she doesn't have pain, just a little soreness.     Objective     Therapeutic Exercise: 20 min  Nustep 8 min tall  UBE x 4  Pulleys in flexion/scaption x 20  Ball rolls in flexion/scaption x 20    US: 7 minutes   AC joint/area to decrease pain     NMR: 3 minutes   Space Correction  3 blocks, I strip, 35% tension, across     Total Treatment time:  30 min                        Assessment:   Patient had no c/o pain after treatment today, we will continue to progress as tolerated.     Goals:   PT STG: In 2 weeks; to be achieved by 06/10/2017:  Pt will be compliant with therapy sessions and HEP.  Pt will report max pain of 4/10.  Pt will have 50% recall and demo of HEP for shoulder ROM, strengthening.    PT LTG: In 4-6 weeks: to be achieved by 07/10/2017  Pt will be I c HEP.  Pt will score <20% on Shoulder Disability index indicating functional improvement since initial evaluation.  Pt will have full painfree shoulder ROM to allow for easier completion of ADL like washing hair.  Pt will report no pain with sleeping.  Pt will be able to lift as needed without pain.  Pt will have 5/5 painless shoulder strength to allow for easier completion of ADL.      Plan:     PT 2x/week x4-6 weeks for therapeutic exercise, manual therapy, US, electrical stimulation.      The risks/benefits of therapy have been discussed with the patient and he/she is in agreement with the established plan of care.    Therapist :          Samule DryKesha Randall, PTA  06/01/2017 11:34

## 2017-06-03 ENCOUNTER — Ambulatory Visit (HOSPITAL_BASED_OUTPATIENT_CLINIC_OR_DEPARTMENT_OTHER)
Admission: RE | Admit: 2017-06-03 | Discharge: 2017-06-03 | Disposition: A | Discharge status: Still In Hospital | Source: Ambulatory Visit | Attending: Family Medicine | Admitting: Family Medicine

## 2017-06-03 NOTE — PT Treatment (Addendum)
[facility]  [place], [place] [id number]     Outpatient Rehabilitation Services  Physical Therapy Progress Note    Patient Name: Carmen Bell  Date of Birth: 08-23-1965  MRN: [id number]  Payor: BLUE CROSS BLUE SHIELD / Plan: FEDERAL BLUE CROSS / Product Type: PPO /   Diagnosis:  Chronic L shoulder pain  Recertification Date: 06/27/2017  Referring Physician:  Dr Rosalyn GessGusic  Next Physician Follow-up Visit: Unknown        06/03/2017  is the patient's 3rd visit    Subjective   She reports no change since last visit.      Objective     Therapeutic Exercise: 20 min  UBE x 4 2 min fw, 2 min bw  Tband: red rows, extension, ER x20 each  Pulleys in flexion/scaption x 20  Ball rolls in flexion/scaption x 20    US: 8 minutes   Subacromial space  3.3 Mhz 1.2 w/cm2  Cont pulse    NMR: 3 minutes   Space Correction  3 blocks, I strip, 35% tension, across     Total Treatment time:  31 min                        Assessment:   Patient had no c/o pain after treatment today, we will continue to progress as tolerated.     Goals:   PT STG: In 2 weeks; to be achieved by 06/10/2017:  Pt will be compliant with therapy sessions and HEP.  Pt will report max pain of 4/10.  Pt will have 50% recall and demo of HEP for shoulder ROM, strengthening.    PT LTG: In 4-6 weeks: to be achieved by 07/10/2017  Pt will be I c HEP.  Pt will score <20% on Shoulder Disability index indicating functional improvement since initial evaluation.  Pt will have full painfree shoulder ROM to allow for easier completion of ADL like washing hair.  Pt will report no pain with sleeping.  Pt will be able to lift as needed without pain.  Pt will have 5/5 painless shoulder strength to allow for easier completion of ADL.      Plan:     PT 2x/week x4-6 weeks for therapeutic exercise, manual therapy, US, electrical stimulation      The risks/benefits of therapy have been discussed with the patient and he/she is in agreement with the established plan of  care.    Therapist :     Nelida MeuseMelissa R Nairi Oswald, PT   06/03/2017 09:57

## 2017-06-08 ENCOUNTER — Ambulatory Visit (HOSPITAL_BASED_OUTPATIENT_CLINIC_OR_DEPARTMENT_OTHER)
Admission: RE | Admit: 2017-06-08 | Discharge: 2017-06-08 | Disposition: A | Discharge status: Still In Hospital | Source: Ambulatory Visit | Attending: Family Medicine | Admitting: Family Medicine

## 2017-06-08 NOTE — PT Treatment (Signed)
[facility]  [place], [place] [id number]     Outpatient Rehabilitation Services  Physical Therapy Progress Note    Patient Name: Carmen CabalMary Irine Jacko  Date of Birth: 1965/05/07  MRN: [id number]  Payor: BLUE CROSS BLUE SHIELD / Plan: FEDERAL BLUE CROSS / Product Type: PPO /   Diagnosis:  Chronic L shoulder pain  Recertification Date: 06/27/2017  Referring Physician:  Dr Rosalyn GessGusic  Next Physician Follow-up Visit: Unknown        06/08/2017  is the patient's 4th visit    Subjective   She reports pain only with extreme positioning.     Objective     Therapeutic Exercise: 20 min  UBE x 4 2 min fw, 2 min bw  Tband: red rows, extension, ER x20 each  Prone extension, horizontal abduction, flexion x20 each  Pulleys in flexion/scaption x 20  Ball rolls in flexion/scaption x 20    US: 8 minutes   Subacromial space  3.3 Mhz 1.2 w/cm2  Cont pulse    NMR: HELD TODAY  Space Correction  3 blocks, I strip, 35% tension, across     Total Treatment time:  31 min                        Assessment:   Weak with prone exercises today.  She would benefit from cont shoulder strengthening exercises.    Goals:   PT STG: In 2 weeks; to be achieved by 06/10/2017:  Pt will be compliant with therapy sessions and HEP.  Pt will report max pain of 4/10.  Pt will have 50% recall and demo of HEP for shoulder ROM, strengthening.    PT LTG: In 4-6 weeks: to be achieved by 07/10/2017  Pt will be I c HEP.  Pt will score <20% on Shoulder Disability index indicating functional improvement since initial evaluation.  Pt will have full painfree shoulder ROM to allow for easier completion of ADL like washing hair.  Pt will report no pain with sleeping.  Pt will be able to lift as needed without pain.  Pt will have 5/5 painless shoulder strength to allow for easier completion of ADL.      Plan:     PT 2x/week x4-6 weeks for therapeutic exercise, manual therapy, US, electrical stimulation      The risks/benefits of therapy have been discussed with  the patient and he/she is in agreement with the established plan of care.    Therapist :     Nelida MeuseMelissa R Caton, PT   06/08/2017 09:53

## 2017-06-10 ENCOUNTER — Ambulatory Visit (HOSPITAL_BASED_OUTPATIENT_CLINIC_OR_DEPARTMENT_OTHER)
Admission: RE | Admit: 2017-06-10 | Discharge: 2017-06-10 | Disposition: A | Discharge status: Still In Hospital | Source: Ambulatory Visit | Attending: Family Medicine | Admitting: Family Medicine

## 2017-06-10 NOTE — PT Treatment (Signed)
[facility]  [place], [place] [id number]     Outpatient Rehabilitation Services  Physical Therapy Progress Note    Patient Name: Carmen CabalMary Irine Bell  Date of Birth: 02-11-1966  MRN: [id number]  Payor: BLUE CROSS BLUE SHIELD / Plan: FEDERAL BLUE CROSS / Product Type: PPO /   Diagnosis:  Chronic L shoulder pain  Recertification Date: 06/27/2017  Referring Physician:  Dr Rosalyn GessGusic  Next Physician Follow-up Visit: Unknown        06/10/2017  is the patient's 5th visit    Subjective   She cont to notice improvement but still painful with extreme positions of the arm.    Objective     Therapeutic Exercise: 20 min  UBE x 4 2 min fw, 2 min bw  Tband: red rows, extension, ER x20 each  Prone extension, horizontal abduction, flexion x20 each  Pulleys in flexion/scaption x 20  Ball rolls in flexion/scaption x 20    US: 8 minutes   Subacromial space  3.3 Mhz 1.2 w/cm2  Cont pulse    NMR: HELD TODAY  Space Correction  3 blocks, I strip, 35% tension, across     Total Treatment time:  31 min                        Assessment:   Weak with prone exercises today.  She would benefit from cont shoulder strengthening exercises.    Goals:   PT STG: In 2 weeks; to be achieved by 06/10/2017:  Pt will be compliant with therapy sessions and HEP.  Pt will report max pain of 4/10.  Pt will have 50% recall and demo of HEP for shoulder ROM, strengthening.    PT LTG: In 4-6 weeks: to be achieved by 07/10/2017  Pt will be I c HEP.  Pt will score <20% on Shoulder Disability index indicating functional improvement since initial evaluation.  Pt will have full painfree shoulder ROM to allow for easier completion of ADL like washing hair.  Pt will report no pain with sleeping.  Pt will be able to lift as needed without pain.  Pt will have 5/5 painless shoulder strength to allow for easier completion of ADL.      Plan:     PT 2x/week x4-6 weeks for therapeutic exercise, manual therapy, US, electrical stimulation      The risks/benefits  of therapy have been discussed with the patient and he/she is in agreement with the established plan of care.    Therapist :     Nelida MeuseMelissa R Caton, PT   06/10/2017 08:43

## 2017-06-15 ENCOUNTER — Ambulatory Visit (HOSPITAL_BASED_OUTPATIENT_CLINIC_OR_DEPARTMENT_OTHER): Payer: Self-pay

## 2017-06-17 ENCOUNTER — Ambulatory Visit (HOSPITAL_BASED_OUTPATIENT_CLINIC_OR_DEPARTMENT_OTHER)
Admission: RE | Admit: 2017-06-17 | Discharge: 2017-06-17 | Disposition: A | Discharge status: Still In Hospital | Source: Ambulatory Visit | Attending: Family Medicine | Admitting: Family Medicine

## 2017-06-17 NOTE — PT Treatment (Signed)
[facility]  [place], [place] [id number]     Outpatient Rehabilitation Services  Physical Therapy Progress Note    Patient Name: Carmen Bell  Date of Birth: 1966-02-28  MRN: [id number]  Payor: BLUE CROSS BLUE SHIELD / Plan: FEDERAL BLUE CROSS / Product Type: PPO /   Diagnosis:  Chronic L shoulder pain  Recertification Date: 06/27/2017  Referring Physician:  Dr Rosalyn GessGusic  Next Physician Follow-up Visit: Unknown      06/17/2017  is the patient's 6th visit    Subjective   Patient reported that she is able to reach for things better since coming to therapy.     Objective     Therapeutic Exercise: 20 min  UBE x 4 2 min fw, 2 min bw  Tband: red rows, extension, ER x20 each  Prone extension, horizontal abduction, flexion x20 each  Pulleys in flexion/scaption x 20  Ball rolls in flexion/scaption x 20    US: 8 minutes   Subacromial space  3.3 Mhz 1.2 w/cm2  Cont pulse    NMR: HELD TODAY  Space Correction  3 blocks, I strip, 35% tension, across     Total Treatment time:  31 min                        Assessment:   Patient did well with prone exercises today and she was able to tolerate her program.     Goals:   PT STG: In 2 weeks; to be achieved by 06/10/2017:  Pt will be compliant with therapy sessions and HEP.  Pt will report max pain of 4/10.  Pt will have 50% recall and demo of HEP for shoulder ROM, strengthening.    PT LTG: In 4-6 weeks: to be achieved by 07/10/2017  Pt will be I c HEP.  Pt will score <20% on Shoulder Disability index indicating functional improvement since initial evaluation.  Pt will have full painfree shoulder ROM to allow for easier completion of ADL like washing hair.  Pt will report no pain with sleeping.  Pt will be able to lift as needed without pain.  Pt will have 5/5 painless shoulder strength to allow for easier completion of ADL.      Plan:     PT 2x/week x4-6 weeks for therapeutic exercise, manual therapy, US, electrical stimulation      The risks/benefits of  therapy have been discussed with the patient and he/she is in agreement with the established plan of care.    Therapist :     Samule DryKesha Jontavia Leatherbury, PTA    06/17/2017 10:52

## 2017-06-21 ENCOUNTER — Ambulatory Visit (HOSPITAL_BASED_OUTPATIENT_CLINIC_OR_DEPARTMENT_OTHER): Payer: Self-pay

## 2017-06-22 ENCOUNTER — Ambulatory Visit (HOSPITAL_BASED_OUTPATIENT_CLINIC_OR_DEPARTMENT_OTHER)
Admission: RE | Admit: 2017-06-22 | Discharge: 2017-06-22 | Disposition: A | Discharge status: Still In Hospital | Source: Ambulatory Visit | Attending: Family Medicine | Admitting: Family Medicine

## 2017-06-22 NOTE — PT Treatment (Signed)
[facility]  [place], [place] [id number]     Outpatient Rehabilitation Services  Physical Therapy Progress Note    Patient Name: Sydnee CabalMary Irine Fissel  Date of Birth: [birth year]  MRN: [id number]  Payor: BLUE CROSS BLUE SHIELD / Plan: FEDERAL BLUE CROSS / Product Type: PPO /   Diagnosis:  Chronic L shoulder pain  Recertification Date: 06/27/2017  Referring Physician:  Dr Rosalyn GessGusic  Next Physician Follow-up Visit: Unknown      06/22/2017 is the patient's 7th visit    Subjective   "It is getting better and better."  "I am starting to be able to push it, reaching into weird positions without pain."    Objective     Therapeutic Exercise: 20 min  UBE x 4 2 min fw, 2 min bw  Tband: red rows, extension, ER x20 each  Prone extension, horizontal abduction, flexion x20 each  Pulleys in flexion/scaption x 20  Ball rolls in flexion/scaption x 20    US: 8 minutes   Subacromial space  3.3 Mhz 1.2 w/cm2  Cont pulse    NMR: HELD TODAY  Space Correction  3 blocks, I strip, 35% tension, across     Total Treatment time:  31 min                        Assessment:   She cont to tolerate progression of rotator cuff/scapular strengthening exercises to help stabilize her humeral head correctly in the joint.  Less impinging signs overall.      Goals:   PT STG: In 2 weeks; to be achieved by 06/10/2017:  Pt will be compliant with therapy sessions and HEP.  Pt will report max pain of 4/10.  Pt will have 50% recall and demo of HEP for shoulder ROM, strengthening.    PT LTG: In 4-6 weeks: to be achieved by 07/10/2017  Pt will be I c HEP.  Pt will score <20% on Shoulder Disability index indicating functional improvement since initial evaluation.  Pt will have full painfree shoulder ROM to allow for easier completion of ADL like washing hair.  Pt will report no pain with sleeping.  Pt will be able to lift as needed without pain.  Pt will have 5/5 painless shoulder strength to allow for easier completion of ADL.      Plan:     PT  2x/week x4-6 weeks for therapeutic exercise, manual therapy, US, electrical stimulation      The risks/benefits of therapy have been discussed with the patient and he/she is in agreement with the established plan of care.    Therapist :     Samule DryKesha Randall, PTA    06/22/2017 08:16

## 2017-06-23 ENCOUNTER — Ambulatory Visit (HOSPITAL_BASED_OUTPATIENT_CLINIC_OR_DEPARTMENT_OTHER): Payer: Self-pay

## 2017-06-24 ENCOUNTER — Ambulatory Visit (HOSPITAL_BASED_OUTPATIENT_CLINIC_OR_DEPARTMENT_OTHER)
Admission: RE | Admit: 2017-06-24 | Discharge: 2017-06-24 | Disposition: A | Discharge status: Still In Hospital | Source: Ambulatory Visit | Attending: Family Medicine | Admitting: Family Medicine

## 2017-06-24 NOTE — PT Treatment (Signed)
[facility]  [place], [place] [id number]     Outpatient Rehabilitation Services  Physical Therapy Progress Note    Patient Name: Carmen CabalMary Irine Bell  Date of Birth: 03-27-1965  MRN: [id number]  Payor: BLUE CROSS BLUE SHIELD / Plan: FEDERAL BLUE CROSS / Product Type: PPO /   Diagnosis:  Chronic L shoulder pain  Recertification Date: 06/27/2017  Referring Physician:  Dr Rosalyn GessGusic  Next Physician Follow-up Visit: Unknown      06/24/2017 is the patient's 8th visit    Subjective   Patient reported that she is doing good.     Objective     Therapeutic Exercise: 20 min  UBE x 4 2 min fw, 2 min bw  Tband: red rows, extension, ER x20 each  Prone extension, horizontal abduction, flexion x20 each  Pulleys in flexion/scaption x 20  Ball rolls in flexion/scaption x 20    US: 8 minutes   A/C joint/deltoid area   3.3 Mhz 1.2 w/cm2  Cont pulse    NMR: 3 minutes   Space Correction  3 blocks, I strip, 35% tension, across     Total Treatment time:  31 min                        Assessment:   Patient continues to tolerate her treatment well, she is having some pain with horiz abd. In prone so she turns her palm upward. She was instructed to keep the thumb up to protect her subacromial space from impingement. She stated that the thumb up is painful and she can only do it with palm up. We will discontinue the prone horizontal abd and have her do it in sitting instead.     Goals:   PT STG: In 2 weeks; to be achieved by 06/10/2017:  Pt will be compliant with therapy sessions and HEP.  Pt will report max pain of 4/10.  Pt will have 50% recall and demo of HEP for shoulder ROM, strengthening.    PT LTG: In 4-6 weeks: to be achieved by 07/10/2017  Pt will be I c HEP.  Pt will score <20% on Shoulder Disability index indicating functional improvement since initial evaluation.  Pt will have full painfree shoulder ROM to allow for easier completion of ADL like washing hair.  Pt will report no pain with sleeping.  Pt will be able to  lift as needed without pain.  Pt will have 5/5 painless shoulder strength to allow for easier completion of ADL.      Plan:     PT 2x/week x4-6 weeks for therapeutic exercise, manual therapy, US, electrical stimulation      The risks/benefits of therapy have been discussed with the patient and he/she is in agreement with the established plan of care.    Therapist :     Samule DryKesha Javarri Segal, PTA    06/24/2017 08:12

## 2017-06-28 ENCOUNTER — Ambulatory Visit (HOSPITAL_BASED_OUTPATIENT_CLINIC_OR_DEPARTMENT_OTHER)
Admission: RE | Admit: 2017-06-28 | Discharge: 2017-06-28 | Disposition: A | Discharge status: Still In Hospital | Source: Ambulatory Visit | Attending: Family Medicine | Admitting: Family Medicine

## 2017-06-28 DIAGNOSIS — M25512 Pain in left shoulder: Secondary | ICD-10-CM | POA: Insufficient documentation

## 2017-06-28 DIAGNOSIS — G8929 Other chronic pain: Secondary | ICD-10-CM | POA: Insufficient documentation

## 2017-06-28 NOTE — PT Treatment (Signed)
[facility]  [place], [place] [id number]     Outpatient Rehabilitation Services  Physical Therapy Progress Note    Patient Name: Carmen Bell  Date of Birth: [birth year]  MRN: [id number]  Payor: BLUE CROSS BLUE SHIELD / Plan: FEDERAL BLUE CROSS / Product Type: PPO /   Diagnosis:  Chronic L shoulder pain  Recertification Date: 06/27/2017  Referring Physician:  Dr Rosalyn GessGusic  Next Physician Follow-up Visit: Unknown      06/28/2017 is the patient's 9th visit    Subjective   She reports she feels about 70% improved.  Still has pain with certain positions.    Objective     Therapeutic Exercise: 20 min  UBE x 4 2 min fw, 2 min bw  Tband: red rows, extension, ER x20 each  Prone extension, horizontal abduction, flexion x20 each  Pulleys in flexion/scaption x 20  Ball rolls in flexion/scaption x 20    US: 8 minutes   A/C joint/deltoid area   3.3 Mhz 1.2 w/cm2  Cont pulse    NMR: 3 minutes   Space Correction  3 blocks, I strip, 35% tension, across     Total Treatment time:  31 min                        Assessment:   No issues with exercises today.    Goals:   PT STG: In 2 weeks; to be achieved by 06/10/2017:  Pt will be compliant with therapy sessions and HEP.  Pt will report max pain of 4/10.  Pt will have 50% recall and demo of HEP for shoulder ROM, strengthening.    PT LTG: In 4-6 weeks: to be achieved by 07/10/2017  Pt will be I c HEP.  Pt will score <20% on Shoulder Disability index indicating functional improvement since initial evaluation.  Pt will have full painfree shoulder ROM to allow for easier completion of ADL like washing hair.  Pt will report no pain with sleeping.  Pt will be able to lift as needed without pain.  Pt will have 5/5 painless shoulder strength to allow for easier completion of ADL.      Plan:     PT 2x/week x4-6 weeks for therapeutic exercise, manual therapy, US, electrical stimulation      The risks/benefits of therapy have been discussed with the patient and he/she is  in agreement with the established plan of care.    Therapist :     Nelida MeuseMelissa R Shyniece Scripter, PT   06/28/2017 13:33

## 2017-07-01 ENCOUNTER — Ambulatory Visit (HOSPITAL_BASED_OUTPATIENT_CLINIC_OR_DEPARTMENT_OTHER)
Admission: RE | Admit: 2017-07-01 | Discharge: 2017-07-01 | Disposition: A | Discharge status: Still In Hospital | Source: Ambulatory Visit | Attending: Family Medicine | Admitting: Family Medicine

## 2017-07-01 NOTE — PT Treatment (Signed)
[facility]  [place], [place] [id number]     Outpatient Rehabilitation Services  Physical Therapy Progress Note    Patient Name: Carmen Bell  Date of Birth: [birth year]  MRN: [id number]  Payor: BLUE CROSS BLUE SHIELD / Plan: FEDERAL BLUE CROSS / Product Type: PPO /   Diagnosis:  Chronic L shoulder pain  Recertification Date: 06/27/2017  Referring Physician:  Dr Rosalyn GessGusic  Next Physician Follow-up Visit: Unknown      07/01/2017 is the patient's 10th visit    Subjective   Carmen Bell reports Carmen Bell feels about 70% improved.  "It's been feeling good, even with increase in activity this week."    Objective     Therapeutic Exercise: 20 min  UBE x 4 2 min fw, 2 min bw  Tband: red rows, extension, ER, punches x20 each  Prone extension, horizontal abduction, flexion x20 each  Pulleys in flexion/scaption x 20  Ball rolls in flexion/scaption x 20    US: 8 minutes   A/C joint/deltoid area   3.3 Mhz 1.2 w/cm2  Cont pulse    NMR: HELD TODAY  Space Correction  3 blocks, I strip, 35% tension, across     Total Treatment time:  28 min                        Assessment:   Improved strength with prone flexion without impinging.  Scapular control with all exercises is improved, without cues needed for proper form.      Goals:   PT STG: In 2 weeks; to be achieved by 06/10/2017:  Pt will be compliant with therapy sessions and HEP.  Pt will report max pain of 4/10.  Pt will have 50% recall and demo of HEP for shoulder ROM, strengthening.    PT LTG: In 4-6 weeks: to be achieved by 07/10/2017  Pt will be I c HEP.  Pt will score <20% on Shoulder Disability index indicating functional improvement since initial evaluation.  Pt will have full painfree shoulder ROM to allow for easier completion of ADL like washing hair.  Pt will report no pain with sleeping.  Pt will be able to lift as needed without pain.  Pt will have 5/5 painless shoulder strength to allow for easier completion of ADL.      Plan:     PT 2x/week x4-6 weeks for  therapeutic exercise, manual therapy, US, electrical stimulation      The risks/benefits of therapy have been discussed with the patient and he/Carmen Bell is in agreement with the established plan of care.    Therapist :     Nelida MeuseMelissa R Brantley Wiley, PT   07/01/2017 16:54

## 2017-07-06 ENCOUNTER — Ambulatory Visit (HOSPITAL_BASED_OUTPATIENT_CLINIC_OR_DEPARTMENT_OTHER)
Admission: RE | Admit: 2017-07-06 | Discharge: 2017-07-06 | Disposition: A | Discharge status: Still In Hospital | Source: Ambulatory Visit | Attending: Family Medicine | Admitting: Family Medicine

## 2017-07-06 NOTE — PT Treatment (Addendum)
[facility]  [place], [place] [address]     Outpatient Rehabilitation Services  Physical Therapy Progress Note    Patient Name: Carmen Bell  Date of Birth: 03-Nov-1965  MRN: [id number]  Payor: BLUE CROSS BLUE SHIELD / Plan: FEDERAL BLUE CROSS / Product Type: PPO /   Diagnosis:  Chronic L shoulder pain  Recertification Date: 06/27/2017  Referring Physician:  Dr Rosalyn Gess  Next Physician Follow-up Visit: Unknown      07/06/2017 is the patient's 11th visit    Subjective   Patient reports that she is mostly having her pain when she is sleeping, meaning that it wakes her up. She is able to pinpoint where the pain is specifically.      Objective     Therapeutic Exercise: 20 min  UBE x 4 2 min fw, 2 min bw  Tband: red rows, extension, ER, punches x20 each  Prone extension, horizontal abduction, flexion x20 each  Pulleys in flexion/scaption x 20  Ball rolls in flexion/scaption x 20    [place]: 8 minutes   A/C joint/deltoid area   3.3 Mhz 1.2 w/cm2  Cont pulse    NMR: 2 minutes  Space Correction  3 blocks, I strip, 35% tension, across     Total Treatment time:  30 min                        Assessment:   Patient did well and tolerated exercises well today. Her pain is getting better and she can now do her prone horiz. abd with the palm down without pain. Before she could only do it with the thumb down which was corrected due to fear of impingement.     Goals:   PT STG: In 2 weeks; to be achieved by 06/10/2017:  Pt will be compliant with therapy sessions and HEP.  Pt will report max pain of 4/10.  Pt will have 50% recall and demo of HEP for shoulder ROM, strengthening.    PT LTG: In 4-6 weeks: to be achieved by 07/10/2017  Pt will be I c HEP.  Pt will score <20% on Shoulder Disability index indicating functional improvement since initial evaluation.  Pt will have full painfree shoulder ROM to allow for easier completion of ADL like washing hair.  Pt will report no pain with sleeping.  Pt will be able to lift  as needed without pain.  Pt will have 5/5 painless shoulder strength to allow for easier completion of ADL.      Plan:     PT 2x/week x4-6 weeks for therapeutic exercise, manual therapy, [place], electrical stimulation      The risks/benefits of therapy have been discussed with the patient and he/she is in agreement with the established plan of care.    Therapist :     Samule Dry, PTA    07/06/2017 08:08

## 2017-07-08 ENCOUNTER — Ambulatory Visit (HOSPITAL_BASED_OUTPATIENT_CLINIC_OR_DEPARTMENT_OTHER)
Admission: RE | Admit: 2017-07-08 | Discharge: 2017-07-08 | Disposition: A | Discharge status: Still In Hospital | Source: Ambulatory Visit | Attending: Family Medicine | Admitting: Family Medicine

## 2017-07-08 NOTE — PT Treatment (Signed)
[facility]  [place], [place] [address]     Outpatient Rehabilitation Services  Physical Therapy Progress Note    Patient Name: Carmen Bell  Date of Birth: [birth year]  MRN: [id number]  Payor: BLUE CROSS BLUE SHIELD / Plan: FEDERAL BLUE CROSS / Product Type: PPO /   Diagnosis:  Chronic L shoulder pain  Recertification Date: 06/27/2017  Referring Physician:  Dr Rosalyn Gess  Next Physician Follow-up Visit: Unknown      07/08/2017 is the patient's 13h visit    Subjective   Patient reports that she is getting worse. She now is having pain during the day and not just at night.     Objective     Therapeutic Exercise: 25 min  UBE x 4 2 min fw, 2 min bw  Tband: red rows, extension, ER, punches x20 each  Prone extension, horizontal abduction, flexion x20 each  Pulleys in flexion/scaption x 20  Ball rolls in flexion/scaption x 20    [place]: 8 minutes HELD   A/C joint/deltoid area   3.3 Mhz 1.2 w/cm2  Cont pulse    NMR: 5 minutes  Mechanical Correction   7 blocks, I strip, 75% tension, to humeral head and along scapular border to depress shoulder      Total Treatment time:  30 min                        Assessment:   Patient did well with exercises today. She is now having pain in her subacromial space during the day now. She was told that she should probably go back to see her doctor to see what is going on. We should give it some time to see if it was just a random thing that is happening. If she is still having the constant pain after a week or so then we will visit the idea of discontinuing therapy until her shoulder pain gets figured out. Taped for mechanical correction to see if it relieves her pain.     Goals:   PT STG: In 2 weeks; to be achieved by 06/10/2017:  Pt will be compliant with therapy sessions and HEP.  Pt will report max pain of 4/10.  Pt will have 50% recall and demo of HEP for shoulder ROM, strengthening.    PT LTG: In 4-6 weeks: to be achieved by 07/10/2017  Pt will be I c HEP.  Pt will  score <20% on Shoulder Disability index indicating functional improvement since initial evaluation.  Pt will have full painfree shoulder ROM to allow for easier completion of ADL like washing hair.  Pt will report no pain with sleeping.  Pt will be able to lift as needed without pain.  Pt will have 5/5 painless shoulder strength to allow for easier completion of ADL.      Plan:     PT 2x/week x4-6 weeks for therapeutic exercise, manual therapy, [place], electrical stimulation      The risks/benefits of therapy have been discussed with the patient and he/she is in agreement with the established plan of care.    Therapist :     Samule Dry, PTA    07/08/2017 08:08

## 2017-07-13 ENCOUNTER — Ambulatory Visit: Payer: Self-pay

## 2017-07-13 ENCOUNTER — Ambulatory Visit (HOSPITAL_BASED_OUTPATIENT_CLINIC_OR_DEPARTMENT_OTHER)
Admission: RE | Admit: 2017-07-13 | Discharge: 2017-07-13 | Disposition: A | Discharge status: Still In Hospital | Source: Ambulatory Visit | Attending: Family Medicine | Admitting: Family Medicine

## 2017-07-13 NOTE — PT Treatment (Signed)
[facility]  [place], [place] [address]     Outpatient Rehabilitation Services  Physical Therapy Progress Note    Patient Name: Carmen Bell  Date of Birth: [birth year]  MRN: [id number]  Payor: BLUE CROSS BLUE SHIELD / Plan: FEDERAL BLUE CROSS / Product Type: PPO /   Diagnosis:  Chronic L shoulder pain  Recertification Date: 06/27/2017  Referring Physician:  Dr Rosalyn Gess  Next Physician Follow-up Visit: Unknown      07/13/2017 is the patient's 14th visit    Subjective   She reports pain was better over the weekend especially when she would watch her positioning/posture.      Objective     Therapeutic Exercise: 20 min  UBE x 4 2 min fw, 2 min bw  Tband: red rows, extension, ER, punches x20 each  Prone extension, horizontal abduction, flexion x20 each    [place]: 8 minutes   A/C joint/deltoid area   3.3 Mhz 1.2 w/cm2  Cont pulse    NMR: 2 minutes  Mechanical Correction   7 blocks, I strip, 75% tension, to humeral head and along scapular border to depress shoulder      Total Treatment time:  30 min                        Assessment:   Still c/o impingement type symptoms, if no change next visit will have her return to MD>    Goals:   PT STG: In 2 weeks; to be achieved by 06/10/2017:  Pt will be compliant with therapy sessions and HEP.  Pt will report max pain of 4/10.  Pt will have 50% recall and demo of HEP for shoulder ROM, strengthening.    PT LTG: In 4-6 weeks: to be achieved by 07/10/2017  Pt will be I c HEP.  Pt will score <20% on Shoulder Disability index indicating functional improvement since initial evaluation.  Pt will have full painfree shoulder ROM to allow for easier completion of ADL like washing hair.  Pt will report no pain with sleeping.  Pt will be able to lift as needed without pain.  Pt will have 5/5 painless shoulder strength to allow for easier completion of ADL.      Plan:     PT 2x/week x4-6 weeks for therapeutic exercise, manual therapy, [place], electrical stimulation      The  risks/benefits of therapy have been discussed with the patient and he/she is in agreement with the established plan of care.    Therapist :     Nelida Meuse, PT     07/13/2017 08:07

## 2017-07-15 ENCOUNTER — Ambulatory Visit: Payer: Self-pay

## 2017-07-15 ENCOUNTER — Ambulatory Visit (HOSPITAL_BASED_OUTPATIENT_CLINIC_OR_DEPARTMENT_OTHER)
Admission: RE | Admit: 2017-07-15 | Discharge: 2017-07-15 | Disposition: A | Discharge status: Still In Hospital | Source: Ambulatory Visit | Attending: Family Medicine | Admitting: Family Medicine

## 2017-07-15 NOTE — PT Treatment (Signed)
[facility]  [place], [place] [address]     Outpatient Rehabilitation Services  Physical Therapy Progress Note    Patient Name: Carmen Bell  Date of Birth: 12-31-1965  MRN: [id number]  Payor: BLUE CROSS BLUE SHIELD / Plan: FEDERAL BLUE CROSS / Product Type: PPO /   Diagnosis:  Chronic L shoulder pain  Recertification Date: 06/27/2017  Referring Physician:  Dr Rosalyn Gess  Next Physician Follow-up Visit: Unknown      07/15/2017 is the patient's 15th visit    Subjective   She reports she is not having the sharp pains like she was.      Objective     Therapeutic Exercise: 20 min  UBE x 4 2 min fw, 2 min bw  Tband: red rows, extension, ER, punches x20 each  Prone extension, horizontal abduction, flexion x20 each    [place]: 8 minutes   A/C joint/deltoid area   3.3 Mhz 1.2 w/cm2  Cont pulse    NMR: 2 minutes  Mechanical Correction   7 blocks, I strip, 75% tension, to humeral head and along scapular border to depress shoulder      Total Treatment time:  30 min                        Assessment:   Sx were improved today, with better scapular control and ROM with exercises.  Will plan for RA next week.    Goals:   PT STG: In 2 weeks; to be achieved by 06/10/2017:  Pt will be compliant with therapy sessions and HEP.  Pt will report max pain of 4/10.  Pt will have 50% recall and demo of HEP for shoulder ROM, strengthening.    PT LTG: In 4-6 weeks: to be achieved by 07/10/2017  Pt will be I c HEP.  Pt will score <20% on Shoulder Disability index indicating functional improvement since initial evaluation.  Pt will have full painfree shoulder ROM to allow for easier completion of ADL like washing hair.  Pt will report no pain with sleeping.  Pt will be able to lift as needed without pain.  Pt will have 5/5 painless shoulder strength to allow for easier completion of ADL.      Plan:     PT 2x/week x4-6 weeks for therapeutic exercise, manual therapy, [place], electrical stimulation      The risks/benefits of therapy  have been discussed with the patient and he/she is in agreement with the established plan of care.    Therapist :     Nelida Meuse, PT     07/15/2017 09:44

## 2017-07-20 ENCOUNTER — Ambulatory Visit (HOSPITAL_BASED_OUTPATIENT_CLINIC_OR_DEPARTMENT_OTHER)
Admission: RE | Admit: 2017-07-20 | Discharge: 2017-07-20 | Disposition: A | Discharge status: Still In Hospital | Source: Ambulatory Visit | Attending: Family Medicine | Admitting: Family Medicine

## 2017-07-20 NOTE — PT Treatment (Signed)
[facility]  [place], [place] [address]     Outpatient Rehabilitation Services  Physical Therapy Progress Note    Patient Name: Carmen Bell  Date of Birth: 1965-04-24  MRN: [id number]  Payor: BLUE CROSS BLUE SHIELD / Plan: FEDERAL BLUE CROSS / Product Type: PPO /   Diagnosis:  Chronic L shoulder pain  Recertification Date: 06/27/2017  Referring Physician:  Dr Rosalyn Gess  Next Physician Follow-up Visit: Unknown      07/20/2017 is the patient's 16th visit    Subjective   She reports she still gets pain in the shoulder without aggravating cause, she can just be sitting there and will get a dull ache in the shoulder.  She feels about 70% improved.    Objective     Therapeutic Exercise: 20 min  UBE x 4 2 min fw, 2 min bw  Tband: red rows, extension, ER, punches x20 each  Prone extension, horizontal abduction, flexion x20 each    [place]: 8 minutes   A/C joint/deltoid area   3.3 Mhz 1.2 w/cm2  Cont pulse    NMR: 2 minutes  Mechanical Correction   7 blocks, I strip, 75% tension, to humeral head and along scapular border to depress shoulder      Total Treatment time:  30 min                        Assessment:   She has excellent recall of exercises, better scapular control and shoulder strength but still having significant pain that can be constant.    Goals:   PT STG: In 2 weeks; to be achieved by 06/10/2017:  Pt will be compliant with therapy sessions and HEP.  Pt will report max pain of 4/10.  Pt will have 50% recall and demo of HEP for shoulder ROM, strengthening.    PT LTG: In 4-6 weeks: to be achieved by 07/10/2017  Pt will be I c HEP.  Pt will score <20% on Shoulder Disability index indicating functional improvement since initial evaluation.  Pt will have full painfree shoulder ROM to allow for easier completion of ADL like washing hair.  Pt will report no pain with sleeping.  Pt will be able to lift as needed without pain.  Pt will have 5/5 painless shoulder strength to allow for easier completion  of ADL.      Plan:     Finalize HEP next visit and D/C, return to MD due to persistent pain.      The risks/benefits of therapy have been discussed with the patient and he/she is in agreement with the established plan of care.    Therapist :     Nelida Meuse, PT     07/20/2017 09:50

## 2017-07-21 ENCOUNTER — Encounter (INDEPENDENT_AMBULATORY_CARE_PROVIDER_SITE_OTHER): Payer: Self-pay | Admitting: Family Medicine

## 2017-07-22 ENCOUNTER — Ambulatory Visit (HOSPITAL_BASED_OUTPATIENT_CLINIC_OR_DEPARTMENT_OTHER)
Admission: RE | Admit: 2017-07-22 | Discharge: 2017-07-22 | Disposition: A | Discharge status: Still In Hospital | Source: Ambulatory Visit | Attending: Family Medicine | Admitting: Family Medicine

## 2017-07-22 NOTE — PT Treatment (Signed)
[facility]  [place], [place] [address]     Outpatient Rehabilitation Services  Physical Therapy Progress Note    Patient Name: Carmen Bell  Date of Birth: 1965-06-27  MRN: [id number]  Payor: BLUE CROSS BLUE SHIELD / Plan: FEDERAL BLUE CROSS / Product Type: PPO /   Diagnosis:  Chronic L shoulder pain  Recertification Date: 06/27/2017  Referring Physician:  Dr Rosalyn Gess  Next Physician Follow-up Visit: Unknown      07/22/2017 is the patient's 17th visit    Subjective   Patient reported that she is still having pain in her deltoid area.    Objective     Therapeutic Exercise: 20 min  UBE x 4 2 min fw, 2 min bw  Tband: red rows, extension, ER, punches x20 each  Prone extension, horizontal abduction, flexion x20 each    [place]: 8 minutes   A/C joint/deltoid area   3.3 Mhz 1.2 w/cm2  Cont pulse    NMR: 2 minutes  Mechanical Correction   7 blocks, I strip, 75% tension, to humeral head and along scapular border to depress shoulder      Total Treatment time:  30 min                        Assessment:   Patient is returning to MD to determine if she is going to need imaging or other treatment for her shoulder pain.     Goals:   PT STG: In 2 weeks; to be achieved by 06/10/2017:  Pt will be compliant with therapy sessions and HEP.  Pt will report max pain of 4/10.  Pt will have 50% recall and demo of HEP for shoulder ROM, strengthening.    PT LTG: In 4-6 weeks: to be achieved by 07/10/2017  Pt will be I c HEP.  Pt will score <20% on Shoulder Disability index indicating functional improvement since initial evaluation.  Pt will have full painfree shoulder ROM to allow for easier completion of ADL like washing hair.  Pt will report no pain with sleeping.  Pt will be able to lift as needed without pain.  Pt will have 5/5 painless shoulder strength to allow for easier completion of ADL.      Plan:     Finalize HEP next visit and D/C, return to MD due to persistent pain.      The risks/benefits of therapy have  been discussed with the patient and he/she is in agreement with the established plan of care.    Therapist :   Samule Dry, PTA    07/22/2017 08:15

## 2017-10-06 ENCOUNTER — Encounter (INDEPENDENT_AMBULATORY_CARE_PROVIDER_SITE_OTHER): Payer: Self-pay

## 2017-10-06 ENCOUNTER — Ambulatory Visit (INDEPENDENT_AMBULATORY_CARE_PROVIDER_SITE_OTHER)

## 2017-10-06 VITALS — BP 137/82 | HR 67 | Temp 98.6°F | Ht 68.0 in | Wt 196.6 lb

## 2017-10-06 DIAGNOSIS — K219 Gastro-esophageal reflux disease without esophagitis: Secondary | ICD-10-CM

## 2017-10-06 DIAGNOSIS — Z6829 Body mass index (BMI) 29.0-29.9, adult: Secondary | ICD-10-CM

## 2017-10-06 DIAGNOSIS — Z1211 Encounter for screening for malignant neoplasm of colon: Secondary | ICD-10-CM

## 2017-10-06 DIAGNOSIS — E739 Lactose intolerance, unspecified: Secondary | ICD-10-CM

## 2017-10-06 DIAGNOSIS — K921 Melena: Secondary | ICD-10-CM

## 2017-10-06 MED ORDER — SODIUM,POTASSIUM,MAG SULFATES 17.5 GRAM-3.13 GRAM-1.6 GRAM ORAL SOLN
ORAL | 0 refills | Status: DC
Start: 2017-10-06 — End: 2017-11-25

## 2017-10-06 NOTE — Patient Instructions (Addendum)
Set up upper endoscopy and colonoscopy with Suprep. The risks of upper endoscopuy and colonoscopy include infection,bleeding,perforation,sedation-anesthesia reaction,respiratory and/or cardiac compromise.For colonoscopy there is a potential for missed lesions-polyps 1.10/998 .Hold aspirin and NSAID's  Like YOURmotrin,alleve,nuprin ibuprofen,etc for 1 week prior to and after endoscopic procedure(s) unless taking for specific heart or neurologic reason as they thin blood.      Get blood tests 1-2 weeks prior to colonoscopy.(to include celiac screening tests)

## 2017-10-06 NOTE — H&P (Signed)
[facility]  [address]  [place] [address]       Name: Carmen Bell MRN:  [id number]   Date: 10/06/2017 Age: 52 y.o.     Requesting Physician: Donia Pounds, MD  History of Present Illness  Carmen Bell is a 52 y.o. female who presents with a chief complaint of Colon Cancer Screening   to clinic.Pt has GERD manifest by cough and retrosteranl burning.Was on PPI for use of NSAID around time of orthopedic surgery.Pt has no melena ,occasional hematochezia,had hemorrhoidectomy x 2 in past. She has no dysphagia.She has lactose intolerance,in past told celiac tests were ?borderline. She avoids dairy.No family history of colon ,esophagus or stomach neoplasia,no family history of celiac. On keto diet with success.   Past History  Current Outpatient Medications   Medication Sig   . acetaminophen (TYLENOL) 500 mg Oral Tablet Take 500 mg by mouth Every 4 hours as needed for Pain   . CALCIUM CARBONATE/VITAMIN D3 (VITAMIN D-3 ORAL) Take 1 Tab by mouth Once a day   . cetirizine (ZYRTEC) 10 mg Oral Tablet Take 10 mg by mouth Once a day   . diazePAM (VALIUM) 5 mg Oral Tablet Take 1 hour prior to MRI   . DIPHENHYDRAMINE HCL (BENADRYL ALLERGY ORAL) Take by mouth   . GLUC/CHON-MSM#1/C/MANG/BOS/BOR (OSTEO BI-FLEX TRIPLE STRENGTH ORAL) Take 1 Tab by mouth   . Ibuprofen (MOTRIN) 800 mg Oral Tablet Take 800 mg by mouth Three times a day as needed for Pain   . MULTIVIT WITH CALCIUM,IRON,MIN (WOMEN'S DAILY MULTIVITAMIN ORAL) Take 1 Tab by mouth Once a day   . raNITIdine (ZANTAC) 150 mg Oral Tablet Take 150 mg by mouth Every night   . Sodium,Potassium,&Mag Sulfates (SUPREP BOWEL PREP KIT) 17.5-3.13-1.6 gram Oral Recon Soln Take as directed by Dr.Glennys Schorsch's office     Allergies   Allergen Reactions   . Flagyl [Metronidazole] Rash     Also yeast infection   . Casein  Other Adverse Reaction (Add comment)     Sinus issues, diarrhea   . Percocet [Oxycodone]  Other Adverse Reaction (Add comment)      Hypotension, Diaphoretic Breathing, Dizziness, Nausea, and Vomiting     History reviewed. No pertinent past medical history.      Past Surgical History:   Procedure Laterality Date   . HX HIP REPLACEMENT Right          Family History  Family Medical History:     Problem Relation (Age of Onset)    No Known Problems Mother, Father, Sister, Brother, Maternal Grandmother, Maternal Grandfather, Paternal Grandmother, Paternal 64, Daughter, Son, Maternal Aunt, Maternal Uncle, Paternal 56, Paternal Uncle, Other            Social History  Social History     Socioeconomic History   . Marital status: Single     Spouse name: Not on file   . Number of children: Not on file   . Years of education: Not on file   . Highest education level: Not on file   Tobacco Use   . Smoking status: Never Smoker   . Smokeless tobacco: Never Used   Substance and Sexual Activity   . Alcohol use: Yes     Alcohol/week: 0.0 oz     Comment: rare   . Drug use: No     Review of Systems    Constitutional: negative  Eyes: negative  Ears, nose, mouth, throat, and face: negative  Respiratory: negative  Cardiovascular: negative  Gastrointestinal: positive for reflux symptoms and hematochezia, negative for dysphagia, odynophagia, dyspepsia, nausea, vomiting, melena, diarrhea, constipation, abdominal pain and jaundice  Genitourinary:negative  Integument/breast: negative  Hematologic/lymphatic: negative  Musculoskeletal:positive for history of hip replacemment  Neurological: negative  Behavioral/Psych: negative  Endocrine: negative  Allergic/Immunologic: see allergy list    Examination  Vitals: BP 137/82   Pulse 67   Temp 37 C (98.6 F) (Tympanic)   Ht 1.727 m (_0 )   Wt 89.2 kg (196 lb 9.6 oz)   BMI 29.89 kg/m     General: no distress  Eyes: Conjunctiva clear.  Neck: supple, symmetrical, trachea midline  Lungs: clear to auscultation bilaterally.   Cardiovascular:    Heart regular rate and rhythm  Abdomen: soft, non-tender  Extremities: no  cyanosis or edema  Skin: Skin warm and dry  Neurologic: grossly normal  Lymphatics: no supraclavicular  lymphadenopathy  Psychiatric: affect normal  Impression  1. GERD (gastroesophageal reflux disease)    2. Screening for colon cancer    3. Lactose intolerance    4. Hematochezia      Recommendations:  Orders Placed This Encounter   . CBC/DIFF   . COMPREHENSIVE METABOLIC PROFILE - [facility] ONLY   . PT/INR   . IMMUNOGLOBULIN A (IGA), SERUM   . IMMUNOGLOBULIN G (IGG), SERUM   . ENDOMYSIAL ANTIBODIES (IGA), SERUM   . TISSUE TRANSGLUTAMINASE (TTG) ANTIBODY, IGG, SERUM   . TISSUE TRANSGLUTAMINASE (TTG) ANTIBODY, IGA, SERUM   . GLIADIN (DEAMIDATED) ANTIBODIES, IGA, SERUM   . GLIADIN (DEAMIDATED) ANTIBODY, IGG, SERUM   . UHP ENDOSCOPY REQUEST (AMB)   . Sodium,Potassium,&Mag Sulfates (SUPREP BOWEL PREP KIT) 17.5-3.13-1.6 gram Oral Recon Soln     Patient Instructions   Set up upper endoscopy and colonoscopy with Suprep. The risks of upper endoscopuy and colonoscopy include infection,bleeding,perforation,sedation-anesthesia reaction,respiratory and/or cardiac compromise.For colonoscopy there is a potential for missed lesions-polyps 1.10/998 .Hold aspirin and NSAID's  Like YOURmotrin,alleve,nuprin ibuprofen,etc for 1 week prior to and after endoscopic procedure(s) unless taking for specific heart or neurologic reason as they thin blood.      Get blood tests 1-2 weeks prior to colonoscopy.(to include celiac screening tests)    Eudelia Bunch, MD

## 2017-10-14 ENCOUNTER — Encounter (INDEPENDENT_AMBULATORY_CARE_PROVIDER_SITE_OTHER): Payer: Self-pay

## 2017-11-04 ENCOUNTER — Ambulatory Visit (HOSPITAL_BASED_OUTPATIENT_CLINIC_OR_DEPARTMENT_OTHER)
Admission: RE | Admit: 2017-11-04 | Discharge: 2017-11-04 | Disposition: A | Discharge status: Home / Self Care | Source: Ambulatory Visit

## 2017-11-04 ENCOUNTER — Encounter (HOSPITAL_BASED_OUTPATIENT_CLINIC_OR_DEPARTMENT_OTHER): Payer: Self-pay

## 2017-11-04 HISTORY — DX: Gastro-esophageal reflux disease without esophagitis: K21.9

## 2017-11-04 HISTORY — DX: Heartburn: R12

## 2017-11-11 ENCOUNTER — Ambulatory Visit (HOSPITAL_BASED_OUTPATIENT_CLINIC_OR_DEPARTMENT_OTHER)

## 2017-11-11 DIAGNOSIS — K219 Gastro-esophageal reflux disease without esophagitis: Secondary | ICD-10-CM

## 2017-11-11 DIAGNOSIS — K921 Melena: Secondary | ICD-10-CM | POA: Insufficient documentation

## 2017-11-11 DIAGNOSIS — E739 Lactose intolerance, unspecified: Secondary | ICD-10-CM

## 2017-11-11 DIAGNOSIS — Z1211 Encounter for screening for malignant neoplasm of colon: Secondary | ICD-10-CM | POA: Insufficient documentation

## 2017-11-11 LAB — CBC WITH DIFF
BASOPHIL #: 0 10*3/uL (ref 0.00–0.10)
BASOPHIL %: 1 % (ref 0–3)
EOSINOPHIL #: 0.1 x10ˆ3/uL (ref 0.00–0.50)
HCT: 39.3 % (ref 36.0–45.0)
HGB: 13.2 g/dL (ref 12.0–15.5)
LYMPHOCYTE #: 1.9 x10ˆ3/uL (ref 1.00–4.80)
LYMPHOCYTE %: 25 % (ref 15–43)
MCH: 29.9 pg (ref 27.5–33.2)
MCHC: 33.5 g/dL (ref 32.0–36.0)
MCV: 89.2 fL (ref 82.0–97.0)
MONOCYTE #: 0.5 x10ˆ3/uL (ref 0.20–0.90)
MONOCYTE %: 6 % (ref 5–12)
MPV: 7.8 fL (ref 7.4–10.5)
NEUTROPHIL #: 5.3 x10ˆ3/uL (ref 1.50–6.50)
NEUTROPHIL %: 67 % (ref 43–76)
PLATELETS: 269 x10ˆ3/uL (ref 150–450)
RBC: 4.4 x10ˆ6/uL (ref 4.00–5.10)
RDW: 14.7 % (ref 11.0–16.0)
WBC: 7.9 x10ˆ3/uL (ref 4.0–11.0)

## 2017-11-11 LAB — COMPREHENSIVE METABOLIC PROFILE - BMC/JMC ONLY
ALBUMIN/GLOBULIN RATIO: 1.5 (ref 0.8–2.0)
ALBUMIN: 4.1 g/dL (ref 3.5–5.0)
ALKALINE PHOSPHATASE: 62 U/L (ref 38–126)
ALT (SGPT): 19 U/L (ref 14–54)
ANION GAP: 13 mmol/L — ABNORMAL HIGH (ref 3–11)
AST (SGOT): 17 U/L (ref 15–41)
BILIRUBIN TOTAL: 0.8 mg/dL (ref 0.3–1.2)
BUN/CREA RATIO: 14 (ref 6–22)
BUN: 13 mg/dL (ref 6–20)
CALCIUM: 9.7 mg/dL (ref 8.6–10.3)
CHLORIDE: 103 mmol/L (ref 101–111)
CO2 TOTAL: 23 mmol/L (ref 22–32)
ESTIMATED GFR: >60 mL/min/1.73mˆ2 (ref >60)
GLUCOSE: 87 mg/dL (ref 70–110)
POTASSIUM: 4 mmol/L (ref 3.4–5.1)
PROTEIN TOTAL: 6.8 g/dL (ref 6.4–8.3)
SODIUM: 139 mmol/L (ref 136–145)

## 2017-11-11 LAB — PT/INR
INR: 1.05
PROTHROMBIN TIME: 12 seconds (ref 9.4–12.5)
PROTHROMBIN TIME: 12 seconds (ref 9.4–12.5)

## 2017-11-12 LAB — IMMUNOGLOBULIN G (IGG), SERUM: IMMUNOGLOBULIN G (IGG): 985 mg/dL (ref 610–1616)

## 2017-11-12 LAB — GLIADIN (DEAMIDATED) ANTIBODY, IGG, SERUM
GLIADIN (DEAMIDATED) ANTIBODY IGG QUALITATIVE: NEGATIVE
GLIADIN (DEAMIDATED) ANTIBODY IGG QUANTITATIVE: <0.4 U/mL (ref <15.0)

## 2017-11-12 LAB — IMMUNOGLOBULIN A (IGA), SERUM: IMMUNOGLOBULIN A (IGA): 263 mg/dL (ref 85–499)

## 2017-11-12 LAB — TISSUE TRANSGLUTAMINASE (TTG) ANTIBODY, IGA, SERUM
TISSUE TRANSGLUTAMINASE ANTIBODIES IGA QUALITATIVE: NEGATIVE
TISSUE TRANSGLUTAMINASE ANTIBODIES IGA QUANTITATIVE: <0.5 U/mL (ref <15.0)

## 2017-11-12 LAB — TISSUE TRANSGLUTAMINASE (TTG) ANTIBODY, IGG, SERUM
TISSUE TRANSGLUTAMINASE ANTIBODIES IGG QUALITATIVE: NEGATIVE
TISSUE TRANSGLUTAMINASE ANTIBODIES IGG QUANTITATIVE: <0.8 U/mL (ref <15.0)

## 2017-11-12 LAB — GLIADIN (DEAMIDATED) ANTIBODIES, IGA, SERUM
GLIADIN (DEAMIDATED) ANTIBODY IGA QUALITATIVE: NEGATIVE
GLIADIN (DEAMIDATED) ANTIBODY IGA QUANTITATIVE: <0.2 U/mL (ref <15.0)

## 2017-11-13 NOTE — H&P (Signed)
Eudelia Bunch, MD   Physician   GASTROENTEROLOGY   H&P   Signed   Encounter Date:  10/06/2017               Expand All Collapse All     '[]'$ Hide copied text    '[]'$ Hover for details     [facility]  [place] [address]        Name:            Carmen Bell MRN:              [id number]   Date:              10/06/2017 Age:            52 y.o.      Requesting Physician: Carmen Pounds, MD  History of Present Illness  Carmen Bell is a 52 y.o. female who presents with a chief complaint of Colon Cancer Screening   to clinic.Pt has GERD manifest by cough and retrosteranl burning.Was on PPI for use of NSAID around time of orthopedic surgery.Pt has no melena ,occasional hematochezia,had hemorrhoidectomy x 2 in past. She has no dysphagia.She has lactose intolerance,in past told celiac tests were ?borderline. She avoids dairy.No family history of colon ,esophagus or stomach neoplasia,no family history of celiac. On keto diet with success.   Past History       Current Outpatient Medications   Medication Sig   . acetaminophen (TYLENOL) 500 mg Oral Tablet Take 500 mg by mouth Every 4 hours as needed for Pain   . CALCIUM CARBONATE/VITAMIN D3 (VITAMIN D-3 ORAL) Take 1 Tab by mouth Once a day   . cetirizine (ZYRTEC) 10 mg Oral Tablet Take 10 mg by mouth Once a day   . diazePAM (VALIUM) 5 mg Oral Tablet Take 1 hour prior to MRI   . DIPHENHYDRAMINE HCL (BENADRYL ALLERGY ORAL) Take by mouth   . GLUC/CHON-MSM#1/C/MANG/BOS/BOR (OSTEO BI-FLEX TRIPLE STRENGTH ORAL) Take 1 Tab by mouth   . Ibuprofen (MOTRIN) 800 mg Oral Tablet Take 800 mg by mouth Three times a day as needed for Pain   . MULTIVIT WITH CALCIUM,IRON,MIN (WOMEN'S DAILY MULTIVITAMIN ORAL) Take 1 Tab by mouth Once a day   . raNITIdine (ZANTAC) 150 mg Oral Tablet Take 150 mg by mouth Every night   . Sodium,Potassium,&Mag Sulfates (SUPREP BOWEL PREP KIT) 17.5-3.13-1.6 gram Oral Recon Soln Take as directed by Dr.Keora Eccleston's  office            Allergies   Allergen Reactions   . Flagyl [Metronidazole] Rash       Also yeast infection   . Casein  Other Adverse Reaction (Add comment)       Sinus issues, diarrhea   . Percocet [Oxycodone]  Other Adverse Reaction (Add comment)       Hypotension, Diaphoretic Breathing, Dizziness, Nausea, and Vomiting      History reviewed. No pertinent past medical history.              Past Surgical History:   Procedure Laterality Date   . HX HIP REPLACEMENT Right              Family History       Family Medical History:         Problem Relation (Age of Onset)     No Known Problems Mother, Father, Sister, Brother, Maternal Grandmother, Maternal Grandfather, Paternal 69, Paternal 51, Daughter, Son, Maternal Aunt, Maternal  Uncle, Paternal Aunt, Paternal Uncle, Other                   Social History  Social History            Socioeconomic History   . Marital status: Single       Spouse name: Not on file   . Number of children: Not on file   . Years of education: Not on file   . Highest education level: Not on file   Tobacco Use   . Smoking status: Never Smoker   . Smokeless tobacco: Never Used   Substance and Sexual Activity   . Alcohol use: Yes       Alcohol/week: 0.0 oz       Comment: rare   . Drug use: No      Review of Systems     Constitutional: negative  Eyes: negative  Ears, nose, mouth, throat, and face: negative  Respiratory: negative  Cardiovascular: negative  Gastrointestinal: positive for reflux symptoms and hematochezia, negative for dysphagia, odynophagia, dyspepsia, nausea, vomiting, melena, diarrhea, constipation, abdominal pain and jaundice  Genitourinary:negative  Integument/breast: negative  Hematologic/lymphatic: negative  Musculoskeletal:positive for history of hip replacemment  Neurological: negative  Behavioral/Psych: negative  Endocrine: negative  Allergic/Immunologic: see allergy list     Examination  Vitals: BP 137/82   Pulse 67   Temp 37 C (98.6 F) (Tympanic)   Ht  1.727 m ('5\' 8"'$ )   Wt 89.2 kg (196 lb 9.6 oz)   BMI 29.89 kg/m      General: no distress  Eyes: Conjunctiva clear.  Neck: supple, symmetrical, trachea midline  Lungs: clear to auscultation bilaterally.   Cardiovascular:    Heart regular rate and rhythm  Abdomen: soft, non-tender  Extremities: no cyanosis or edema  Skin: Skin warm and dry  Neurologic: grossly normal  Lymphatics: no supraclavicular  lymphadenopathy  Psychiatric: affect normal  Impression  1. GERD (gastroesophageal reflux disease)    2. Screening for colon cancer    3. Lactose intolerance    4. Hematochezia       Recommendations:      Orders Placed This Encounter   . CBC/DIFF   . COMPREHENSIVE METABOLIC PROFILE - [facility] ONLY   . PT/INR   . IMMUNOGLOBULIN A (IGA), SERUM   . IMMUNOGLOBULIN G (IGG), SERUM   . ENDOMYSIAL ANTIBODIES (IGA), SERUM   . TISSUE TRANSGLUTAMINASE (TTG) ANTIBODY, IGG, SERUM   . TISSUE TRANSGLUTAMINASE (TTG) ANTIBODY, IGA, SERUM   . GLIADIN (DEAMIDATED) ANTIBODIES, IGA, SERUM   . GLIADIN (DEAMIDATED) ANTIBODY, IGG, SERUM   . UHP ENDOSCOPY REQUEST (AMB)   . Sodium,Potassium,&Mag Sulfates (SUPREP BOWEL PREP KIT) 17.5-3.13-1.6 gram Oral Recon Soln      Patient Instructions   Set up upper endoscopy and colonoscopy with Suprep. The risks of upper endoscopuy and colonoscopy include infection,bleeding,perforation,sedation-anesthesia reaction,respiratory and/or cardiac compromise.For colonoscopy there is a potential for missed lesions-polyps 1.10/998 .Hold aspirin and NSAID's  Like YOURmotrin,alleve,nuprin ibuprofen,etc for 1 week prior to and after endoscopic procedure(s) unless taking for specific heart or neurologic reason as they thin blood.        Get blood tests 1-2 weeks prior to colonoscopy.(to include celiac screening tests)     Eudelia Bunch, MD       Electronically signed by Eudelia Bunch, MD at 10/06/17 1122      Office Visit on 10/06/2017         Routing History  Detailed Report

## 2017-11-16 LAB — ENDOMYSIAL ANTIBODIES (IGA), SERUM: ENDOMYSIAL ABS: NEGATIVE

## 2017-11-18 ENCOUNTER — Encounter (HOSPITAL_BASED_OUTPATIENT_CLINIC_OR_DEPARTMENT_OTHER): Payer: Self-pay

## 2017-11-25 ENCOUNTER — Other Ambulatory Visit (HOSPITAL_BASED_OUTPATIENT_CLINIC_OR_DEPARTMENT_OTHER): Payer: Self-pay

## 2017-11-25 ENCOUNTER — Encounter (HOSPITAL_BASED_OUTPATIENT_CLINIC_OR_DEPARTMENT_OTHER): Payer: Self-pay

## 2017-11-25 ENCOUNTER — Ambulatory Visit (HOSPITAL_BASED_OUTPATIENT_CLINIC_OR_DEPARTMENT_OTHER): Admitting: Anesthesiology

## 2017-11-25 ENCOUNTER — Other Ambulatory Visit (HOSPITAL_BASED_OUTPATIENT_CLINIC_OR_DEPARTMENT_OTHER)

## 2017-11-25 ENCOUNTER — Ambulatory Visit (HOSPITAL_BASED_OUTPATIENT_CLINIC_OR_DEPARTMENT_OTHER): Admitting: Certified Registered"

## 2017-11-25 ENCOUNTER — Inpatient Hospital Stay (HOSPITAL_BASED_OUTPATIENT_CLINIC_OR_DEPARTMENT_OTHER)
Admission: RE | Admit: 2017-11-25 | Discharge: 2017-11-25 | Disposition: A | Discharge status: Home / Self Care | Source: Ambulatory Visit

## 2017-11-25 ENCOUNTER — Encounter (HOSPITAL_BASED_OUTPATIENT_CLINIC_OR_DEPARTMENT_OTHER): Admission: RE | Disposition: A | Discharge status: Home / Self Care | Payer: Self-pay | Source: Ambulatory Visit

## 2017-11-25 DIAGNOSIS — K640 First degree hemorrhoids: Secondary | ICD-10-CM | POA: Insufficient documentation

## 2017-11-25 DIAGNOSIS — K921 Melena: Secondary | ICD-10-CM

## 2017-11-25 DIAGNOSIS — Z791 Long term (current) use of non-steroidal anti-inflammatories (NSAID): Secondary | ICD-10-CM | POA: Insufficient documentation

## 2017-11-25 DIAGNOSIS — Z96641 Presence of right artificial hip joint: Secondary | ICD-10-CM | POA: Insufficient documentation

## 2017-11-25 DIAGNOSIS — Z79899 Other long term (current) drug therapy: Secondary | ICD-10-CM | POA: Insufficient documentation

## 2017-11-25 DIAGNOSIS — K219 Gastro-esophageal reflux disease without esophagitis: Secondary | ICD-10-CM | POA: Insufficient documentation

## 2017-11-25 DIAGNOSIS — E739 Lactose intolerance, unspecified: Secondary | ICD-10-CM | POA: Insufficient documentation

## 2017-11-25 DIAGNOSIS — K319 Disease of stomach and duodenum, unspecified: Secondary | ICD-10-CM

## 2017-11-25 DIAGNOSIS — K3189 Other diseases of stomach and duodenum: Secondary | ICD-10-CM

## 2017-11-25 DIAGNOSIS — Z1211 Encounter for screening for malignant neoplasm of colon: Secondary | ICD-10-CM

## 2017-11-25 SURGERY — GASTROSCOPY WITH BIOPSY
Anesthesia: IV General | Wound class: Clean Contaminated Wounds-The respiratory, GI, Genital, or urinary

## 2017-11-25 MED ORDER — PROPOFOL 10 MG/ML IV - CHI
INTRAVENOUS | Status: DC | PRN
Start: 2017-11-25 — End: 2017-11-25
  Administered 2017-11-25: 0 ug/kg/min via INTRAVENOUS
  Administered 2017-11-25: 150 ug/kg/min via INTRAVENOUS

## 2017-11-25 MED ORDER — LACTATED RINGERS INTRAVENOUS SOLUTION
INTRAVENOUS | Status: DC
Start: 2017-11-25 — End: 2017-11-25

## 2017-11-25 MED ORDER — LIDOCAINE (PF) 100 MG/5 ML (2 %) INTRAVENOUS SYRINGE
INJECTION | Freq: Once | INTRAVENOUS | Status: DC | PRN
Start: 2017-11-25 — End: 2017-11-25
  Administered 2017-11-25: 100 mg via INTRAVENOUS

## 2017-11-25 MED ORDER — FAMOTIDINE 20 MG TABLET
20.00 mg | ORAL_TABLET | Freq: Two times a day (BID) | ORAL | 5 refills | Status: DC | PRN
Start: 2017-11-25 — End: 2017-11-25

## 2017-11-25 MED ORDER — FAMOTIDINE 20 MG TABLET
20.00 mg | ORAL_TABLET | Freq: Every evening | ORAL | 5 refills | Status: DC
Start: 2017-11-25 — End: 2017-11-25

## 2017-11-25 MED ORDER — FAMOTIDINE 20 MG TABLET
20.00 mg | ORAL_TABLET | Freq: Every day | ORAL | 5 refills | Status: AC | PRN
Start: 2017-11-25 — End: ?

## 2017-11-25 MED ORDER — FENTANYL (PF) 50 MCG/ML INJECTION SOLUTION
Freq: Once | INTRAMUSCULAR | Status: DC | PRN
Start: 2017-11-25 — End: 2017-11-25
  Administered 2017-11-25: 50 ug via INTRAVENOUS

## 2017-11-25 MED ORDER — PROPOFOL 10 MG/ML IV BOLUS
INJECTION | Freq: Once | INTRAVENOUS | Status: DC | PRN
Start: 2017-11-25 — End: 2017-11-25
  Administered 2017-11-25 (×2): 50 mg via INTRAVENOUS

## 2017-11-25 MED ORDER — MIDAZOLAM 1 MG/ML INJECTION SOLUTION
Freq: Once | INTRAMUSCULAR | Status: DC | PRN
Start: 2017-11-25 — End: 2017-11-25
  Administered 2017-11-25: 1 mg via INTRAVENOUS

## 2017-11-25 MED ORDER — PSYLLIUM ORAL PACKET WRAPPER
1.0000 | PACK | Freq: Every day | 2 refills | Status: DC
Start: 2017-11-25 — End: 2017-11-25

## 2017-11-25 MED ADMIN — propofol 10 mg/mL intravenous emulsion: INTRAVENOUS | @ 13:00:00

## 2017-11-25 SURGICAL SUPPLY — 28 items
ADAPTER CATH 4-6FR URET PLASTI C STRL LF DISP (UROLOGICAL SUPPLIES) ×1
ADAPTER CATH URET PLASTIC 4-6FR STRL LF  DISP (UROLOGICAL SUPPLIES) ×1 IMPLANT
BLOCK BITE 60FR BLOX DISP (GENE) ×1 IMPLANT
BLOCK BITE OD60 ENDO (GENE) ×2
CATH ELHMST INJ GLD PRB 7FR 25 GA .24MM 210CM BIPO RND DIST (BALLOON)
CATH ELHMST INJ GLD PROBE 7FR 25GA .24MM 210CM BIPOLAR RND DIST TIP STD CONN INTGR DISP 2.8MM MN WRK (BALLOON) IMPLANT
CLIP HMST RADOPQ PRELD STRL DISP RSL 235CM 2.8MM 11MM OPN (SURGICAL INSTRUMENTS) IMPLANT
CONV USE ITEM 321852 - GLOVE SURG 6.5 LF PF BEAD CUF STRL PROTEXIS PI PLISPRN (GLOVES AND ACCESSORIES) ×1 IMPLANT
DEVICE RESOLUTION CLIP 235CML M00522611 11MMW 10EA/BX (INSTRUMENTS)
FORCEPS BIOPSY MICROMESH TTH STREAMLINE CATH NEEDLE 240CM 2.4MM RJ 4 SS LRG CPC STRL DISP ORNG 2.8MM (SURGICAL INSTRUMENTS) ×1 IMPLANT
FORCEPS BIOPSY NEEDLE 240CM 2. 4MM RJ 4 2.8MM LRG CPC STRL (INSTRUMENTS) ×1
GLOVE SURG 6.5 LF PF BEAD CUF STRL PROTEXIS PI PLISPRN (GLOVES AND ACCESSORIES) ×1
GW ENDOSCOPIC .035IN 260CM DREAMWIRE ANG RX EGLD NITINOL BIL STRL DISP (ENDOSCOPIC SUPPLIES) IMPLANT
KIT COLON W/1.1 OZ LUBE DUAL (GENE) ×1
KIT ENDOS CMPLN ENDOKIT DISP COLON (GENE) ×1 IMPLANT
NEEDLE SCLRTX 23GA 2.3MM .32MM CNRST SHEATH INNER CATH STRL DISP INTJCT 4MM 240CM (NEEDLES & SYRINGE SUPPLIES) IMPLANT
NEEDLE SCLRTX 23GA 2.3MM .32MM INNER CATH CNRST SHTH STRL (NEEDLES & SYRINGE SUPPLIES)
PAD TRNSPT CINCHPAD 42X17_TRNSLU LEAK PROOF ABS GRN ENDO (INSTRUMENTS ENDOMECHANICAL) ×1
PAD TRNSPT GRN 42X17IN CINCHPAD TRANSLUC LEAK PRF ABS ENDOSCP (ENDOSCOPIC SUPPLIES) ×1 IMPLANT
SNARE LRG OVL MED STF ENDOS OL_MPS DISP (INSTRUMENTS ENDOMECHANICAL)
SYRINGE INFLAT ALN II GA STRL DISP 60ML (NEEDLES & SYRINGE SUPPLIES) IMPLANT
SYRINGE LL 30ML LF  STRL CONCEN TIP GRAD N-PYRG DEHP-FR MED DISP (NEEDLES & SYRINGE SUPPLIES) IMPLANT
SYRINGE LL 30ML LF STRL CONCE N TIP GRAD N-PYRG DEHP-FR MED (NEEDLES & SYRINGE SUPPLIES)
TRAP SPEC RETR 15CM ETRAP PLPC_TM STRL DISP (INSTRUMENTS)
TRAP SPECI REM 15CM ETRAP MAGNIFY WIND MSR GUIDE PLPCTM STRL LF  DISP (SURGICAL INSTRUMENTS) IMPLANT
USE ITEM 60432 FORCEPS BIOPSY MICROMESH TTH STREAMLINE CATH NEEDLE 240CM 2.4MM RJ 4 SS LRG CPC STRL DISP ORNG 2.8MM (SURGICAL INSTRUMENTS) ×1 IMPLANT
WATER STRL 1000ML PLASTIC PR BTL LF (SOLUTIONS) ×1 IMPLANT
WATER STRL 1000ML PLASTIC PR B_TL LF (SOLUTIONS) ×1

## 2017-11-25 NOTE — Anesthesia Transfer of Care (Signed)
ANESTHESIA TRANSFER OF CARE   Carmen CabalMary Irine Bell is a 52 y.o. ,female, Weight: 84.8 kg (187 lb)   had Procedure(s):  GASTROSCOPY WITH BIOPSY  COLONOSCOPY  performed  11/25/17   Primary Service: Zara Chessimothy Orphanides,*    Past Medical History:   Diagnosis Date   . Esophageal reflux    . Heartburn       Allergy History as of 11/25/17     METRONIDAZOLE       Noted Status Severity Type Reaction    01/08/15 1517 Rupert StacksMoore, Julie 01/08/15 Active Medium  Rash    Comments:  Also yeast infection           CASEIN       Noted Status Severity Type Reaction    01/08/15 1518 Rupert StacksMoore, Julie 01/08/15 Active    Other Adverse Reaction (Add comment)    Comments:  Sinus issues, diarrhea           OXYCODONE       Noted Status Severity Type Reaction    11/26/15 0846 Samule Dryandall, Kesha, PTA 11/26/15 Active    Other Adverse Reaction (Add comment)    Comments:  Hypotension, Diaphoretic Breathing, Dizziness, Nausea, and Vomiting               I completed my transfer of care / handoff to the receiving personnel during which we discussed:  All key/critical aspects of case discussed    Post Location: Phase II                                                                Last OR Temp: Temperature: 36.9 C (98.5 F)  ABG:  POTASSIUM   Date Value Ref Range Status   11/11/2017 4.0 3.4 - 5.1 mmol/L Final     KETONES   Date Value Ref Range Status   05/03/2017 Negative Negative mg/dL Final     CALCIUM   Date Value Ref Range Status   11/11/2017 9.7 8.6 - 10.3 mg/dL Final     Airway:* No LDAs found *  Blood pressure (!) 99/57, pulse 91, temperature 36.9 C (98.5 F), resp. rate 16, height 1.727 m (5\' 8" ), weight 84.8 kg (187 lb), SpO2 98 %.

## 2017-11-25 NOTE — Anesthesia Postprocedure Evaluation (Signed)
Anesthesia Post Op Evaluation    Patient: Carmen Bell  Procedure(s):  GASTROSCOPY WITH BIOPSY  COLONOSCOPY    Last Vitals:Temperature: 36.9 C (98.5 F) (11/25/17 1223)  Heart Rate: 91 (11/25/17 1327)  BP (Non-Invasive): (!) 99/57 (11/25/17 1327)  Respiratory Rate: 16 (11/25/17 1327)  SpO2: 98 % (11/25/17 1327)    Patient is sufficiently recovered from the effects of anesthesia to participate in the evaluation and has returned to their pre-procedure level.  Patient location during evaluation: PACU       Patient participation: complete - patient participated  Level of consciousness: awake and alert and responsive to verbal stimuli  Pain management: adequate  Airway patency: patent  Anesthetic complications: no  Cardiovascular status: acceptable  Respiratory status: acceptable  Hydration status: acceptable  Patient post-procedure temperature: Pt Normothermic   PONV Status: Absent

## 2017-11-25 NOTE — H&P (Signed)
Carmen Bell, [place] [id number]       Short stay H&P    Carmen Bell, 52 y.o. female, Carmen Bell  Date of Birth:  1966-02-29  Encounter Start Date:  11/25/2017  Inpatient Admission Date:   PCP: Donia Pounds, MD    HPI   Carmen Bell is a 52 y.o. female who presents with     1. GERD (gastroesophageal reflux disease)    2. Screening for colon cancer    3. Lactose intolerance    4. Hematochezia         PAST MEDICAL/ FAMILY/ SOCIAL HISTORY:    Past Medical History:   Diagnosis Date   . Esophageal reflux    . Heartburn          Past Surgical History:   Procedure Laterality Date   . HX HIP REPLACEMENT Right          Family History:     Family Medical History:     Problem Relation (Age of Onset)    No Known Problems Mother, Father, Sister, Brother, Maternal Grandmother, Maternal Grandfather, Paternal Grandmother, Paternal 63, Daughter, Son, Maternal Aunt, Maternal Uncle, Paternal 91, Paternal Uncle, Other        Social History     Tobacco Use   . Smoking status: Never Smoker   . Smokeless tobacco: Never Used   Substance Use Topics   . Alcohol use: Yes     Alcohol/week: 0.0 standard drinks     Comment: rare     Allergies   Allergen Reactions   . Flagyl [Metronidazole] Rash     Also yeast infection   . Casein  Other Adverse Reaction (Add comment)     Sinus issues, diarrhea   . Percocet [Oxycodone]  Other Adverse Reaction (Add comment)     Hypotension, Diaphoretic Breathing, Dizziness, Nausea, and Vomiting     Medications Prior to Admission     Prescriptions    acetaminophen (TYLENOL) 500 mg Oral Tablet    Take 500 mg by mouth Every 4 hours as needed for Pain    cetirizine (ZYRTEC) 10 mg Oral Tablet    Take 10 mg by mouth Once a day    cholecalciferol, Vitamin D3, 5,000 unit Oral Tablet    Take 7,000 Units by mouth Once a day    DIPHENHYDRAMINE HCL (BENADRYL ALLERGY ORAL)    Take 50 mg by mouth Every night     GLUC/CHON-MSM#1/C/MANG/BOS/BOR (OSTEO BI-FLEX TRIPLE STRENGTH ORAL)    Take 1 Tab by mouth    Ibuprofen  (MOTRIN) 800 mg Oral Tablet    Take 800 mg by mouth Three times a day as needed for Pain    MULTIVIT WITH CALCIUM,IRON,MIN (WOMEN'S DAILY MULTIVITAMIN ORAL)    Take 1 Tab by mouth Once a day    raNITIdine (ZANTAC) 150 mg Oral Tablet    Take 150 mg by mouth Every night    Sodium,Potassium,&Mag Sulfates (SUPREP BOWEL PREP KIT) 17.5-3.13-1.6 gram Oral Recon Soln    Take as directed by Dr.Oren Barella's office    zolpidem (AMBIEN) 5 mg Oral Tablet    Take 5 mg by mouth Every night as needed for Insomnia           ROS/Physical exam    Vitals  Filed Vitals:    11/25/17 1223   BP: 119/68   Pulse: 82   Resp: 16   Temp: 36.9 C (98.5 F)   SpO2: 97%       Physical exam:  General:  Normal  HEENT:  Normal  Cardiovascular:  Normal  Lungs:  Normal  Breasts:  defered  Abdomen:  Normal  Genitalia:  defered  Extremities:  Normal  Skin:  Normal  Neurologic:  Normal    Labs  I have reviewed all lab results.    IMPRESSION & PLAN:   1. GERD (gastroesophageal reflux disease)    2. Screening for colon cancer    3. Lactose intolerance    4. Hematochezia       Plan: EGD and colonoscopy  Eudelia Bunch, MD

## 2017-11-25 NOTE — Nurses Notes (Signed)
Patient discharged home with family.  AVS reviewed with patient/care giver.  A written copy of the AVS and discharge instructions was given to the patient/care giver.  Questions sufficiently answered as needed.  Patient/care giver encouraged to follow up with PCP as indicated.  In the event of an emergency, patient/care giver instructed to call 911 or go to the nearest emergency room.

## 2017-11-25 NOTE — Nurses Notes (Signed)
Dr at bedside to talk to pt and pt family.  DC instructions given at bedside.

## 2017-11-25 NOTE — Anesthesia Preprocedure Evaluation (Signed)
ANESTHESIA PRE-OP EVALUATION  Planned Procedure: GASTROSCOPY WITH BIOPSY (N/A )  COLONOSCOPY WITH BIOPSY (N/A )  COLONOSCOPY WITH POLYPECTOMY (N/A )  Review of Systems         patient summary reviewed  nursing notes reviewed        Pulmonary     Cardiovascular           GI/Hepatic/Renal        Endo/Other          Neuro/Psych/MS        Cancer                     Physical Assessment      Patient summary reviewed and Nursing notes reviewed   Airway       Mallampati: II    TM distance: >3 FB    Neck ROM: full  Mouth Opening: fair.            Dental       Dentition intact             Pulmonary    Breath sounds clear to auscultation       Cardiovascular    Rhythm: regular  Rate: Normal       Other findings            Plan  Planned anesthesia type: IV general    ASA 1     Intravenous induction     Anesthetic plan and risks discussed with patient.     Anesthesia issues/risks discussed are: PONV, Post-op Pain Management, Cardiac Events/MI and Stroke.        Patient's NPO status is appropriate for Anesthesia.                   Activity Tolerance : >4 METs  EKG: Sinus  Plan: IVGA

## 2017-11-25 NOTE — Discharge Instructions (Signed)
[facility]  [place], [place] [address]     Anesthesia Discharge Instructions    1.  Do NOT drive an automobile today.    2.  Do NOT sign any legal documents for 24 hours.    3.  Do NOT operate any electrical equipment today.    Fall risk prevention education has been reviewed with the patient/significant other.

## 2017-11-25 NOTE — OR PreOp (Signed)
RN can lock up belongings while in surgery, patient understands any missing belongings are patients responsibility. Patient instructed to please send any valuable items with family/visitor to ensure no lost or stolen items.

## 2017-11-29 LAB — HISTORICAL SURGICAL PATHOLOGY SPECIMEN

## 2017-12-01 ENCOUNTER — Ambulatory Visit (HOSPITAL_BASED_OUTPATIENT_CLINIC_OR_DEPARTMENT_OTHER): Payer: Self-pay

## 2018-01-04 ENCOUNTER — Ambulatory Visit (INDEPENDENT_AMBULATORY_CARE_PROVIDER_SITE_OTHER): Admitting: Family Medicine

## 2018-01-04 ENCOUNTER — Encounter (INDEPENDENT_AMBULATORY_CARE_PROVIDER_SITE_OTHER): Payer: Self-pay | Admitting: Family Medicine

## 2018-01-04 VITALS — BP 136/76 | HR 60 | Temp 97.4°F | Resp 16 | Ht 68.0 in | Wt 188.0 lb

## 2018-01-04 DIAGNOSIS — N951 Menopausal and female climacteric states: Secondary | ICD-10-CM

## 2018-01-04 DIAGNOSIS — Z6828 Body mass index (BMI) 28.0-28.9, adult: Secondary | ICD-10-CM

## 2018-01-04 DIAGNOSIS — G8929 Other chronic pain: Secondary | ICD-10-CM

## 2018-01-04 DIAGNOSIS — K219 Gastro-esophageal reflux disease without esophagitis: Secondary | ICD-10-CM

## 2018-01-04 DIAGNOSIS — M25512 Pain in left shoulder: Secondary | ICD-10-CM

## 2018-01-04 DIAGNOSIS — N941 Unspecified dyspareunia: Principal | ICD-10-CM

## 2018-01-04 DIAGNOSIS — Z13228 Encounter for screening for other metabolic disorders: Secondary | ICD-10-CM

## 2018-01-04 DIAGNOSIS — G4700 Insomnia, unspecified: Secondary | ICD-10-CM

## 2018-01-04 MED ORDER — ESTRADIOL 10 MCG VAGINAL TABLET
ORAL_TABLET | VAGINAL | 3 refills | Status: AC
Start: 2018-01-04 — End: ?

## 2018-01-04 NOTE — Progress Notes (Signed)
[facility]  FAM[facility] MEDICINE [place], [place] [facility]  [address]  [address]  [place] [address]  [phone]    Follow up    Chief Complaint:   Chief Complaint   Patient presents with   . Follow Up For Therapy     follow up visit of PT       History of Present Illness  Carmen Bell is a 52 y.o. female, who presents today for follow-up.  Since her last visit, she has been through physical therapy for her left shoulder.  She reports that this pain has significantly improved.  She has not gone back to exercising regularly, but has lost over 40 lb through a ketogenic diet over the past 6 months.  Patient recently underwent upper and lower endoscopies, which were unremarkable.  Patient reports she has been sleeping well.  Her only concern today is pain with intercourse.  She reports this has been worsening over the past few months.  Patient is peri menopausal, and menses are becoming irregular.  She is also having occasional hot flashes and night sweats.  She has not tried any kind of lubricants or anything else for her symptoms.    Past History  Current Outpatient Medications   Medication Sig   . acetaminophen (TYLENOL) 500 mg Oral Tablet Take 500 mg by mouth Every 4 hours as needed for Pain   . cetirizine (ZYRTEC) 10 mg Oral Tablet Take 10 mg by mouth Once a day   . cholecalciferol, Vitamin D3, 5,000 unit Oral Tablet Take 7,000 Units by mouth Once a day   . DIPHENHYDRAMINE HCL (BENADRYL ALLERGY ORAL) Take 50 mg by mouth Every night    . estradiol (VAGIFEM) vaginal tablet Administer 1 tab intravaginally daily x 2 weeks, then twice weekly   . famotidine (PEPCID) 20 mg Oral Tablet Take 1 Tab (20 mg total) by mouth Once per day as needed   . GLUC/CHON-MSM#1/C/MANG/BOS/BOR (OSTEO BI-FLEX TRIPLE STRENGTH ORAL) Take 1 Tab by mouth   . Ibuprofen (MOTRIN) 800 mg Oral Tablet Take 800 mg by mouth Three times a day as needed for Pain   . MULTIVIT WITH CALCIUM,IRON,MIN (WOMEN'S  DAILY MULTIVITAMIN ORAL) Take 1 Tab by mouth Once a day   . UNKNOWN MEDICATION (UNKNOWN MEDICATION) Take by mouth Once a day   . zolpidem (AMBIEN) 5 mg Oral Tablet Take 5 mg by mouth Every night as needed for Insomnia     Allergies   Allergen Reactions   . Flagyl [Metronidazole] Rash     Also yeast infection   . Casein  Other Adverse Reaction (Add comment)     Sinus issues, diarrhea   . Percocet [Oxycodone]  Other Adverse Reaction (Add comment)     Hypotension, Diaphoretic Breathing, Dizziness, Nausea, and Vomiting     Past Medical History:   Diagnosis Date   . Esophageal reflux    . Heartburn        Past Surgical History:   Procedure Laterality Date   . COLONSCOPY BEDSIDE     . HX HIP REPLACEMENT Right    . HX UPPER ENDOSCOPY             Family History  Family Medical History:     Problem Relation (Age of Onset)    No Known Problems Mother, Father, Sister, Brother, Maternal Grandmother, Maternal Grandfather, Paternal Grandmother, Paternal Grandfather, Daughter, Son, Maternal Aunt, Maternal Uncle, Paternal Aunt, Paternal Uncle, Other  Social History  Social History     Socioeconomic History   . Marital status: Single     Spouse name: Not on file   . Number of children: Not on file   . Years of education: Not on file   . Highest education level: Not on file   Tobacco Use   . Smoking status: Never Smoker   . Smokeless tobacco: Never Used   Substance and Sexual Activity   . Alcohol use: Yes     Alcohol/week: 0.0 standard drinks     Comment: rare   . Drug use: No   Other Topics Concern   . Ability to Walk 2 Flight of Steps without SOB/CP Yes       Review of Systems  Constitutional: no fevers, chills  HEENT: no headache, sore throat, ear pain, or nasal congestion  Heart: no chest pain or palpitations  Pulm: No cough or shortness of breath  GI: no n/v/d    Examination  Vitals: BP 136/76   Pulse 60   Temp 36.3 C (97.4 F) (Oral)   Resp 16   Ht 1.727 m (5\' 8" )   Wt 85.3 kg (188 lb)   LMP 12/29/2017   BMI  28.59 kg/m     General: appears in good health and no distress.  Eyes: Conjunctiva clear., Pupils equal and round.   HENT: head NCAT, MMM.  Neck: supple.  Cardiovascular: RRR, 1-2/6 systolic murmur  Lungs: clear to auscultation bilaterally.   Abdomen: soft, non-tender and bowel sounds normal.  Extremities: no cyanosis or edema.  Skin: No rashes or lesions and warm and dry.  Neurologic: Cranial nerves: 2-12 grossly intact.  Psychiatric: normal affect, behavior, memory, thought content, judgement, and speech.      Diagnosis and Plan    1. Dyspareunia, female    2. Screening for metabolic disorder    3. Gastroesophageal reflux disease without esophagitis    4. Menopausal symptoms    5. Chronic left shoulder pain        Left shoulder pain:  Improved after physical therapy.  Encouraged patient to start regular exercise at the gym.       GERD:  Recent upper endoscopy unremarkable.  Continue famotidine 20 mg daily.     Insomnia:  Continue Ambien 5 mg nightly p.r.n.Marland Kitchen     Dyspareunia:  Discussed with patient that this is likely due to atrophic vaginal tissue secondary to menopause.  Encouraged her to try a lubricant with intercourse.  Will also start her on vaginal him 10 mg intravaginally every day x2 weeks, then twice weekly.     Lipid panel ordered   Mammo ordered    Pap nl summer 2017 w/ neg HPV   Colonoscopy without polyps 9/19   Received flu shot this year; received both shingrix shots      Follow up in 1 year for physical    Orders Placed This Encounter   . LIPID PANEL   . estradiol (VAGIFEM) vaginal tablet       Griffith Citron, MD

## 2018-01-04 NOTE — Nursing Note (Signed)
Chief Complaint:   Chief Complaint            Follow Up For Therapy follow up visit of PT        Functional Health Screen        Vital Signs  BP (!) 160/88   Pulse 60   Temp 36.3 C (97.4 F) (Oral)   Resp 16   Ht 1.727 m (5\' 8" )   Wt 85.3 kg (188 lb)   LMP 12/29/2017   BMI 28.59 kg/m       Social History     Tobacco Use   Smoking Status Never Smoker   Smokeless Tobacco Never Used     Allergies  Allergies   Allergen Reactions   . Flagyl [Metronidazole] Rash     Also yeast infection   . Casein  Other Adverse Reaction (Add comment)     Sinus issues, diarrhea   . Percocet [Oxycodone]  Other Adverse Reaction (Add comment)     Hypotension, Diaphoretic Breathing, Dizziness, Nausea, and Vomiting     Medication History  Reviewed for OTC medication and any new medications, provider will review medication history  Care Team  Patient Care Team:  Griffith Citron, MD as PCP - General ([facility])  Immunizations - last 24 hours     None        Gaspar Garbe, [place]  01/04/2018, 17:22

## 2018-01-10 ENCOUNTER — Encounter (INDEPENDENT_AMBULATORY_CARE_PROVIDER_SITE_OTHER): Payer: Self-pay

## 2018-01-15 ENCOUNTER — Ambulatory Visit (HOSPITAL_BASED_OUTPATIENT_CLINIC_OR_DEPARTMENT_OTHER): Attending: Family Medicine

## 2018-01-15 DIAGNOSIS — Z13228 Encounter for screening for other metabolic disorders: Secondary | ICD-10-CM | POA: Insufficient documentation

## 2018-01-15 LAB — LIPID PANEL
CHOL/HDL RATIO: 2.9
CHOLESTEROL: 245 mg/dL — ABNORMAL HIGH (ref 120–199)
HDL CHOL: 84 mg/dL (ref >39)
LDL CALC: 156 mg/dL — ABNORMAL HIGH (ref <=130)
TRIGLYCERIDES: 27 mg/dL (ref <150)
TRIGLYCERIDES: 27 mg/dL (ref <150)
VLDL CALC: 5 mg/dL (ref 5–35)

## 2018-01-24 NOTE — Progress Notes (Signed)
Hi Irine,    Cholesterol panel shows mildly elevated LDL, but your triglycerides and HDL look great!  Cholesterol/HDL ratio is excellent.    Griffith CitronBrittany Gusic, MD  01/24/2018, 12:34

## 2018-07-05 ENCOUNTER — Encounter (INDEPENDENT_AMBULATORY_CARE_PROVIDER_SITE_OTHER): Payer: Self-pay | Admitting: Family Medicine

## 2018-07-20 ENCOUNTER — Encounter (INDEPENDENT_AMBULATORY_CARE_PROVIDER_SITE_OTHER): Admitting: Family Medicine

## 2018-08-11 ENCOUNTER — Encounter (INDEPENDENT_AMBULATORY_CARE_PROVIDER_SITE_OTHER): Payer: Self-pay | Admitting: Family Medicine

## 2018-08-24 ENCOUNTER — Encounter (INDEPENDENT_AMBULATORY_CARE_PROVIDER_SITE_OTHER): Admitting: Family Medicine

## 2018-10-20 ENCOUNTER — Other Ambulatory Visit (HOSPITAL_BASED_OUTPATIENT_CLINIC_OR_DEPARTMENT_OTHER): Payer: Self-pay | Admitting: INTERNAL MEDICINE

## 2018-10-20 DIAGNOSIS — Z1231 Encounter for screening mammogram for malignant neoplasm of breast: Secondary | ICD-10-CM

## 2018-11-18 ENCOUNTER — Other Ambulatory Visit: Payer: Self-pay

## 2018-11-21 ENCOUNTER — Ambulatory Visit (HOSPITAL_BASED_OUTPATIENT_CLINIC_OR_DEPARTMENT_OTHER): Payer: Self-pay

## 2018-12-21 ENCOUNTER — Ambulatory Visit
Admission: RE | Admit: 2018-12-21 | Discharge: 2018-12-21 | Disposition: A | Discharge status: Home / Self Care | Source: Ambulatory Visit | Attending: INTERNAL MEDICINE | Admitting: INTERNAL MEDICINE

## 2018-12-21 ENCOUNTER — Encounter (HOSPITAL_BASED_OUTPATIENT_CLINIC_OR_DEPARTMENT_OTHER): Payer: Self-pay

## 2018-12-21 ENCOUNTER — Other Ambulatory Visit: Payer: Self-pay

## 2018-12-21 DIAGNOSIS — Z1231 Encounter for screening mammogram for malignant neoplasm of breast: Secondary | ICD-10-CM

## 2019-12-07 ENCOUNTER — Other Ambulatory Visit (HOSPITAL_COMMUNITY): Payer: Self-pay | Admitting: PHYSICIAN ASSISTANT

## 2019-12-07 DIAGNOSIS — Z1231 Encounter for screening mammogram for malignant neoplasm of breast: Secondary | ICD-10-CM

## 2019-12-26 ENCOUNTER — Other Ambulatory Visit: Payer: Self-pay

## 2019-12-26 ENCOUNTER — Encounter (HOSPITAL_BASED_OUTPATIENT_CLINIC_OR_DEPARTMENT_OTHER): Payer: Self-pay

## 2019-12-26 ENCOUNTER — Ambulatory Visit
Admission: RE | Admit: 2019-12-26 | Discharge: 2019-12-26 | Disposition: A | Discharge status: Home / Self Care | Source: Ambulatory Visit | Attending: PHYSICIAN ASSISTANT | Admitting: PHYSICIAN ASSISTANT

## 2019-12-26 DIAGNOSIS — Z1231 Encounter for screening mammogram for malignant neoplasm of breast: Secondary | ICD-10-CM | POA: Insufficient documentation

## 2020-01-20 ENCOUNTER — Encounter

## 2021-03-11 ENCOUNTER — Other Ambulatory Visit (HOSPITAL_COMMUNITY): Payer: Self-pay | Admitting: INTERNAL MEDICINE

## 2021-03-11 DIAGNOSIS — Z1231 Encounter for screening mammogram for malignant neoplasm of breast: Secondary | ICD-10-CM

## 2021-03-19 ENCOUNTER — Encounter (HOSPITAL_BASED_OUTPATIENT_CLINIC_OR_DEPARTMENT_OTHER): Payer: Self-pay

## 2021-03-19 ENCOUNTER — Inpatient Hospital Stay
Admission: RE | Admit: 2021-03-19 | Discharge: 2021-03-19 | Disposition: A | Discharge status: Home / Self Care | Source: Ambulatory Visit | Attending: INTERNAL MEDICINE | Admitting: INTERNAL MEDICINE

## 2021-03-19 ENCOUNTER — Other Ambulatory Visit: Payer: Self-pay

## 2021-03-19 DIAGNOSIS — Z1231 Encounter for screening mammogram for malignant neoplasm of breast: Secondary | ICD-10-CM | POA: Insufficient documentation

## 2021-04-11 ENCOUNTER — Ambulatory Visit (HOSPITAL_BASED_OUTPATIENT_CLINIC_OR_DEPARTMENT_OTHER): Payer: Self-pay
